# Patient Record
Sex: Female | Born: 1985 | Race: Black or African American | Hispanic: No | Smoking: Former smoker
Health system: Southern US, Community
[De-identification: ages and names within clinical notes are randomized; demographics above are authoritative.]

## PROBLEM LIST (undated history)

## (undated) ENCOUNTER — Inpatient Hospital Stay (HOSPITAL_COMMUNITY): Payer: Self-pay

---

## 2013-12-17 ENCOUNTER — Ambulatory Visit: Payer: Self-pay

## 2013-12-31 ENCOUNTER — Ambulatory Visit (INDEPENDENT_AMBULATORY_CARE_PROVIDER_SITE_OTHER): Payer: Self-pay

## 2013-12-31 DIAGNOSIS — IMO0001 Reserved for inherently not codable concepts without codable children: Secondary | ICD-10-CM

## 2013-12-31 DIAGNOSIS — Z3490 Encounter for supervision of normal pregnancy, unspecified, unspecified trimester: Secondary | ICD-10-CM

## 2013-12-31 DIAGNOSIS — Z3480 Encounter for supervision of other normal pregnancy, unspecified trimester: Secondary | ICD-10-CM

## 2013-12-31 LAB — POCT PREGNANCY, URINE: Preg Test, Ur: POSITIVE — AB

## 2013-12-31 LAB — HIV ANTIBODY (ROUTINE TESTING W REFLEX): HIV: NONREACTIVE

## 2013-12-31 LAB — GLUCOSE TOLERANCE, 1 HOUR (50G) W/O FASTING: GLUCOSE 1 HOUR GTT: 84 mg/dL (ref 70–140)

## 2013-12-31 NOTE — Progress Notes (Signed)
Pt came in for pregnancy test.  Resulted positive.  Pt states will be getting prenatal care here.  Pt informed me that she was unsure of LMP.  Scheduled a dating US for March 9th @ 145pm .  Prenatal labs drawn today along with early glucola due to patient informing me that her father is a diabetic.

## 2014-01-01 LAB — PRESCRIPTION MONITORING PROFILE (19 PANEL)
Amphetamine/Meth: NEGATIVE ng/mL
BARBITURATE SCREEN, URINE: NEGATIVE ng/mL
BUPRENORPHINE, URINE: NEGATIVE ng/mL
Benzodiazepine Screen, Urine: NEGATIVE ng/mL
CANNABINOID SCRN UR: NEGATIVE ng/mL
Carisoprodol, Urine: NEGATIVE ng/mL
Cocaine Metabolites: NEGATIVE ng/mL
Creatinine, Urine: 196 mg/dL (ref >20.0)
Fentanyl, Ur: NEGATIVE ng/mL
MDMA URINE: NEGATIVE ng/mL
METHAQUALONE SCREEN (URINE): NEGATIVE ng/mL
Meperidine, Ur: NEGATIVE ng/mL
Methadone Screen, Urine: NEGATIVE ng/mL
Nitrites, Initial: NEGATIVE ug/mL
OPIATE SCREEN, URINE: NEGATIVE ng/mL
Oxycodone Screen, Ur: NEGATIVE ng/mL
PHENCYCLIDINE, UR: NEGATIVE ng/mL
Propoxyphene: NEGATIVE ng/mL
TAPENTADOLUR: NEGATIVE ng/mL
Tramadol Scrn, Ur: NEGATIVE ng/mL
ZOLPIDEM, URINE: NEGATIVE ng/mL
pH, Initial: 6.6 pH (ref 4.5–8.9)

## 2014-01-01 LAB — OBSTETRIC PANEL
Antibody Screen: NEGATIVE
BASOS ABS: 0 10*3/uL (ref 0.0–0.1)
Basophils Relative: 0 % (ref 0–1)
EOS ABS: 0.1 10*3/uL (ref 0.0–0.7)
EOS PCT: 1 % (ref 0–5)
HCT: 36.2 % (ref 36.0–46.0)
HEP B S AG: NEGATIVE
Hemoglobin: 12.3 g/dL (ref 12.0–15.0)
LYMPHS PCT: 35 % (ref 12–46)
Lymphs Abs: 1.9 10*3/uL (ref 0.7–4.0)
MCH: 29.5 pg (ref 26.0–34.0)
MCHC: 34 g/dL (ref 30.0–36.0)
MCV: 86.8 fL (ref 78.0–100.0)
Monocytes Absolute: 0.2 10*3/uL (ref 0.1–1.0)
Monocytes Relative: 4 % (ref 3–12)
Neutro Abs: 3.2 10*3/uL (ref 1.7–7.7)
Neutrophils Relative %: 60 % (ref 43–77)
PLATELETS: 283 10*3/uL (ref 150–400)
RBC: 4.17 MIL/uL (ref 3.87–5.11)
RDW: 13.2 % (ref 11.5–15.5)
RUBELLA: 2.39 {index} — AB (ref <0.90)
Rh Type: POSITIVE
WBC: 5.4 10*3/uL (ref 4.0–10.5)

## 2014-01-01 LAB — CULTURE, OB URINE
COLONY COUNT: NO GROWTH
Organism ID, Bacteria: NO GROWTH

## 2014-01-02 LAB — HEMOGLOBINOPATHY EVALUATION
HGB A2 QUANT: 2.8 % (ref 2.2–3.2)
HGB A: 97.2 % (ref 96.8–97.8)
Hemoglobin Other: 0 %
Hgb F Quant: 0 % (ref 0.0–2.0)
Hgb S Quant: 0 %

## 2014-01-06 ENCOUNTER — Ambulatory Visit (HOSPITAL_COMMUNITY)
Admission: RE | Admit: 2014-01-06 | Discharge: 2014-01-06 | Disposition: A | Discharge status: Home / Self Care | Payer: Self-pay | Source: Ambulatory Visit | Attending: Obstetrics and Gynecology | Admitting: Obstetrics and Gynecology

## 2014-01-06 ENCOUNTER — Encounter (HOSPITAL_COMMUNITY): Payer: Self-pay

## 2014-01-06 DIAGNOSIS — Z3689 Encounter for other specified antenatal screening: Secondary | ICD-10-CM | POA: Insufficient documentation

## 2014-01-06 DIAGNOSIS — IMO0001 Reserved for inherently not codable concepts without codable children: Secondary | ICD-10-CM

## 2014-01-06 DIAGNOSIS — O43899 Other placental disorders, unspecified trimester: Secondary | ICD-10-CM

## 2014-01-06 DIAGNOSIS — O368990 Maternal care for other specified fetal problems, unspecified trimester, not applicable or unspecified: Secondary | ICD-10-CM | POA: Insufficient documentation

## 2014-01-07 ENCOUNTER — Telehealth: Payer: Self-pay

## 2014-01-07 ENCOUNTER — Encounter: Payer: Self-pay | Admitting: Obstetrics and Gynecology

## 2014-01-07 DIAGNOSIS — O3680X0 Pregnancy with inconclusive fetal viability, not applicable or unspecified: Secondary | ICD-10-CM

## 2014-01-07 NOTE — Telephone Encounter (Signed)
Message copied by Louanna RawAMPBELL, Angelissa Supan M on Tue Jan 07, 2014  1:52 PM ------      Message from: CONSTANT, Gigi GinPEGGY      Created: Tue Jan 07, 2014  9:05 AM       Patient needs repeat ultrasound in 1 week to confirm viability prior to initiating prenatal care on 3/31. Ultrasound showed a 6 week fetus without cardiac activity (could represent early pregnancy vs failed miscarriage)            Please schedule f/u ultrasound and inform patient.            Thanks            Clinical cytogeneticisteggy ------

## 2014-01-07 NOTE — Telephone Encounter (Signed)
Follow up ultrasound scheduled for 1600 on March 17th. Called pt. And informed her of appointment date and time. Pt. Verbalized understanding and gratitude and had no further questions or concerns.

## 2014-01-14 ENCOUNTER — Ambulatory Visit (HOSPITAL_COMMUNITY)
Admission: RE | Admit: 2014-01-14 | Discharge: 2014-01-14 | Disposition: A | Discharge status: Home / Self Care | Payer: Self-pay | Source: Ambulatory Visit | Attending: Obstetrics and Gynecology | Admitting: Obstetrics and Gynecology

## 2014-01-14 ENCOUNTER — Other Ambulatory Visit: Payer: Self-pay | Admitting: Family Medicine

## 2014-01-14 ENCOUNTER — Other Ambulatory Visit: Payer: Self-pay | Admitting: Obstetrics and Gynecology

## 2014-01-14 DIAGNOSIS — O3680X0 Pregnancy with inconclusive fetal viability, not applicable or unspecified: Secondary | ICD-10-CM

## 2014-01-14 DIAGNOSIS — O208 Other hemorrhage in early pregnancy: Secondary | ICD-10-CM | POA: Insufficient documentation

## 2014-01-14 DIAGNOSIS — Z3689 Encounter for other specified antenatal screening: Secondary | ICD-10-CM | POA: Insufficient documentation

## 2014-01-14 DIAGNOSIS — O021 Missed abortion: Secondary | ICD-10-CM | POA: Insufficient documentation

## 2014-01-14 MED ORDER — MISOPROSTOL 200 MCG PO TABS: 1000.0000 ug | ORAL_TABLET | Freq: Once | ORAL | Status: DC

## 2014-01-14 NOTE — Progress Notes (Signed)
TVUS is consistent with failed IUP, 6 wk size fetus with no FHR, no change over 7 day interval. Pt. Advised of diagnosis--options given.  Elects cytotec.  Called to pharmacy.  Instructions given.

## 2014-01-28 ENCOUNTER — Encounter: Payer: Self-pay | Admitting: Obstetrics and Gynecology

## 2014-02-14 ENCOUNTER — Inpatient Hospital Stay (HOSPITAL_COMMUNITY)
Admission: AD | Admit: 2014-02-14 | Discharge: 2014-02-15 | Disposition: A | Discharge status: Home / Self Care | Payer: Self-pay | Source: Ambulatory Visit | Attending: Family Medicine | Admitting: Family Medicine

## 2014-02-14 ENCOUNTER — Encounter (HOSPITAL_COMMUNITY): Payer: Self-pay | Admitting: *Deleted

## 2014-02-14 DIAGNOSIS — A499 Bacterial infection, unspecified: Secondary | ICD-10-CM | POA: Insufficient documentation

## 2014-02-14 DIAGNOSIS — IMO0001 Reserved for inherently not codable concepts without codable children: Secondary | ICD-10-CM | POA: Insufficient documentation

## 2014-02-14 DIAGNOSIS — N760 Acute vaginitis: Secondary | ICD-10-CM | POA: Insufficient documentation

## 2014-02-14 DIAGNOSIS — B9689 Other specified bacterial agents as the cause of diseases classified elsewhere: Secondary | ICD-10-CM | POA: Insufficient documentation

## 2014-02-14 DIAGNOSIS — Z87891 Personal history of nicotine dependence: Secondary | ICD-10-CM | POA: Insufficient documentation

## 2014-02-14 DIAGNOSIS — R1031 Right lower quadrant pain: Secondary | ICD-10-CM | POA: Insufficient documentation

## 2014-02-14 DIAGNOSIS — M549 Dorsalgia, unspecified: Secondary | ICD-10-CM | POA: Insufficient documentation

## 2014-02-14 DIAGNOSIS — M7918 Myalgia, other site: Secondary | ICD-10-CM

## 2014-02-14 LAB — POCT PREGNANCY, URINE: Preg Test, Ur: NEGATIVE

## 2014-02-14 NOTE — MAU Note (Signed)
Having pain in my R back and R lower abd while at work. Felt crampy. I'm on my feet a lot at work and live on 3rd floor and just hurts. Worse with activity. Like a cramp that shoots up my R back. Recently had miscarriage about 01/15/14. Took pills to pass pregnancy. Have appt Weds in clinic

## 2014-02-15 ENCOUNTER — Encounter (HOSPITAL_COMMUNITY): Payer: Self-pay | Admitting: *Deleted

## 2014-02-15 ENCOUNTER — Inpatient Hospital Stay (HOSPITAL_COMMUNITY): Payer: Self-pay

## 2014-02-15 DIAGNOSIS — IMO0001 Reserved for inherently not codable concepts without codable children: Secondary | ICD-10-CM

## 2014-02-15 LAB — URINALYSIS, ROUTINE W REFLEX MICROSCOPIC
BILIRUBIN URINE: NEGATIVE
Glucose, UA: NEGATIVE mg/dL
Hgb urine dipstick: NEGATIVE
KETONES UR: NEGATIVE mg/dL
Leukocytes, UA: NEGATIVE
Nitrite: NEGATIVE
PROTEIN: NEGATIVE mg/dL
Specific Gravity, Urine: 1.025 (ref 1.005–1.030)
UROBILINOGEN UA: 0.2 mg/dL (ref 0.0–1.0)
pH: 6 (ref 5.0–8.0)

## 2014-02-15 LAB — WET PREP, GENITAL
Trich, Wet Prep: NONE SEEN
YEAST WET PREP: NONE SEEN

## 2014-02-15 LAB — CBC
HEMATOCRIT: 34.3 % — AB (ref 36.0–46.0)
Hemoglobin: 11.4 g/dL — ABNORMAL LOW (ref 12.0–15.0)
MCH: 29.7 pg (ref 26.0–34.0)
MCHC: 33.2 g/dL (ref 30.0–36.0)
MCV: 89.3 fL (ref 78.0–100.0)
Platelets: 229 10*3/uL (ref 150–400)
RBC: 3.84 MIL/uL — ABNORMAL LOW (ref 3.87–5.11)
RDW: 12.8 % (ref 11.5–15.5)
WBC: 6.9 10*3/uL (ref 4.0–10.5)

## 2014-02-15 MED ORDER — CYCLOBENZAPRINE HCL 10 MG PO TABS: 5.0000 mg | ORAL_TABLET | Freq: Three times a day (TID) | ORAL | Status: AC | PRN

## 2014-02-15 MED ORDER — METRONIDAZOLE 500 MG PO TABS: 500.0000 mg | ORAL_TABLET | Freq: Two times a day (BID) | ORAL | Status: DC

## 2014-02-15 NOTE — Discharge Instructions (Signed)
Bacterial Vaginosis Bacterial vaginosis is a vaginal infection that occurs when the normal balance of bacteria in the vagina is disrupted. It results from an overgrowth of certain bacteria. This is the most common vaginal infection in women of childbearing age. Treatment is important to prevent complications, especially in pregnant women, as it can cause a premature delivery. CAUSES  Bacterial vaginosis is caused by an increase in harmful bacteria that are normally present in smaller amounts in the vagina. Several different kinds of bacteria can cause bacterial vaginosis. However, the reason that the condition develops is not fully understood. RISK FACTORS Certain activities or behaviors can put you at an increased risk of developing bacterial vaginosis, including:  Having a new sex partner or multiple sex partners.  Douching.  Using an intrauterine device (IUD) for contraception. Women do not get bacterial vaginosis from toilet seats, bedding, swimming pools, or contact with objects around them. SIGNS AND SYMPTOMS  Some women with bacterial vaginosis have no signs or symptoms. Common symptoms include:  Grey vaginal discharge.  A fishlike odor with discharge, especially after sexual intercourse.  Itching or burning of the vagina and vulva.  Burning or pain with urination. DIAGNOSIS  Your health care provider will take a medical history and examine the vagina for signs of bacterial vaginosis. A sample of vaginal fluid may be taken. Your health care provider will look at this sample under a microscope to check for bacteria and abnormal cells. A vaginal pH test may also be done.  TREATMENT  Bacterial vaginosis may be treated with antibiotic medicines. These may be given in the form of a pill or a vaginal cream. A second round of antibiotics may be prescribed if the condition comes back after treatment.  HOME CARE INSTRUCTIONS   Only take over-the-counter or prescription medicines as  directed by your health care provider.  If antibiotic medicine was prescribed, take it as directed. Make sure you finish it even if you start to feel better.  Do not have sex until treatment is completed.  Tell all sexual partners that you have a vaginal infection. They should see their health care provider and be treated if they have problems, such as a mild rash or itching.  Practice safe sex by using condoms and only having one sex partner. SEEK MEDICAL CARE IF:   Your symptoms are not improving after 3 days of treatment.  You have increased discharge or pain.  You have a fever. MAKE SURE YOU:   Understand these instructions.  Will watch your condition.  Will get help right away if you are not doing well or get worse. FOR MORE INFORMATION  Centers for Disease Control and Prevention, Division of STD Prevention: SolutionApps.co.zawww.cdc.gov/std American Sexual Health Association (ASHA): www.ashastd.org  Document Released: 10/17/2005 Document Revised: 08/07/2013 Document Reviewed: 05/29/2013 [employer] [facility] Patient Information 2014 [place], [place].   Abdominal Pain, Women Abdominal (stomach, pelvic, or belly) pain can be caused by many things. It is important to tell your doctor:  The location of the pain.  Does it come and go or is it present all the time?  Are there things that start the pain (eating certain foods, exercise)?  Are there other symptoms associated with the pain (fever, nausea, vomiting, diarrhea)? All of this is helpful to know when trying to find the cause of the pain. CAUSES   Stomach: virus or bacteria infection, or ulcer.  Intestine: appendicitis (inflamed appendix), regional ileitis (Crohn's disease), ulcerative colitis (inflamed colon), irritable bowel syndrome, diverticulitis (inflamed diverticulum of  the colon), or cancer of the stomach or intestine.  Gallbladder disease or stones in the gallbladder.  Kidney disease, kidney stones, or infection.  Pancreas infection  or cancer.  Fibromyalgia (pain disorder).  Diseases of the female organs:  Uterus: fibroid (non-cancerous) tumors or infection.  Fallopian tubes: infection or tubal pregnancy.  Ovary: cysts or tumors.  Pelvic adhesions (scar tissue).  Endometriosis (uterus lining tissue growing in the pelvis and on the pelvic organs).  Pelvic congestion syndrome (female organs filling up with blood just before the menstrual period).  Pain with the menstrual period.  Pain with ovulation (producing an egg).  Pain with an IUD (intrauterine device, birth control) in the uterus.  Cancer of the female organs.  Functional pain (pain not caused by a disease, may improve without treatment).  Psychological pain.  Depression. DIAGNOSIS  Your doctor will decide the seriousness of your pain by doing an examination.  Blood tests.  X-rays.  Ultrasound.  CT scan (computed tomography, special type of X-ray).  MRI (magnetic resonance imaging).  Cultures, for infection.  Barium enema (dye inserted in the large intestine, to better view it with X-rays).  Colonoscopy (looking in intestine with a lighted tube).  Laparoscopy (minor surgery, looking in abdomen with a lighted tube).  Major abdominal exploratory surgery (looking in abdomen with a large incision). TREATMENT  The treatment will depend on the cause of the pain.   Many cases can be observed and treated at home.  Over-the-counter medicines recommended by your caregiver.  Prescription medicine.  Antibiotics, for infection.  Birth control pills, for painful periods or for ovulation pain.  Hormone treatment, for endometriosis.  Nerve blocking injections.  Physical therapy.  Antidepressants.  Counseling with a psychologist or psychiatrist.  Minor or major surgery. HOME CARE INSTRUCTIONS   Do not take laxatives, unless directed by your caregiver.  Take over-the-counter pain medicine only if ordered by your caregiver. Do  not take aspirin because it can cause an upset stomach or bleeding.  Try a clear liquid diet (broth or water) as ordered by your caregiver. Slowly move to a bland diet, as tolerated, if the pain is related to the stomach or intestine.  Have a thermometer and take your temperature several times a day, and record it.  Bed rest and sleep, if it helps the pain.  Avoid sexual intercourse, if it causes pain.  Avoid stressful situations.  Keep your follow-up appointments and tests, as your caregiver orders.  If the pain does not go away with medicine or surgery, you may try:  Acupuncture.  Relaxation exercises (yoga, meditation).  Group therapy.  Counseling. SEEK MEDICAL CARE IF:   You notice certain foods cause stomach pain.  Your home care treatment is not helping your pain.  You need stronger pain medicine.  You want your IUD removed.  You feel faint or lightheaded.  You develop nausea and vomiting.  You develop a rash.  You are having side effects or an allergy to your medicine. SEEK IMMEDIATE MEDICAL CARE IF:   Your pain does not go away or gets worse.  You have a fever.  Your pain is felt only in portions of the abdomen. The right side could possibly be appendicitis. The left lower portion of the abdomen could be colitis or diverticulitis.  You are passing blood in your stools (bright red or black tarry stools, with or without vomiting).  You have blood in your urine.  You develop chills, with or without a fever.  You  pass out. MAKE SURE YOU:   Understand these instructions.  Will watch your condition.  Will get help right away if you are not doing well or get worse. Document Released: 08/14/2007 Document Revised: 01/09/2012 Document Reviewed: 09/03/2009 [facility] Patient Information 2014 [place]ExitCare, [place]LL[place].  Musculoskeletal Pain Musculoskeletal pain is muscle and boney aches and pains. These pains can occur in any part of the body. Your caregiver may  treat you without knowing the cause of the pain. They may treat you if blood or urine tests, X-rays, and other tests were normal.  CAUSES There is often not a definite cause or reason for these pains. These pains may be caused by a type of germ (virus). The discomfort may also come from overuse. Overuse includes working out too hard when your body is not fit. Boney aches also come from weather changes. Bone is sensitive to atmospheric pressure changes. HOME CARE INSTRUCTIONS   Ask when your test results will be ready. Make sure you get your test results.  Only take over-the-counter or prescription medicines for pain, discomfort, or fever as directed by your caregiver. If you were given medications for your condition, do not drive, operate machinery or power tools, or sign legal documents for 24 hours. Do not drink alcohol. Do not take sleeping pills or other medications that may interfere with treatment.  Continue all activities unless the activities cause more pain. When the pain lessens, slowly resume normal activities. Gradually increase the intensity and duration of the activities or exercise.  During periods of severe pain, bed rest may be helpful. Lay or sit in any position that is comfortable.  Putting ice on the injured area.  Put ice in a bag.  Place a towel between your skin and the bag.  Leave the ice on for 15 to 20 minutes, 3 to 4 times a day.  Follow up with your caregiver for continued problems and no reason can be found for the pain. If the pain becomes worse or does not go away, it may be necessary to repeat tests or do additional testing. Your caregiver may need to look further for a possible cause. SEEK IMMEDIATE MEDICAL CARE IF:  You have pain that is getting worse and is not relieved by medications.  You develop chest pain that is associated with shortness or breath, sweating, feeling sick to your stomach (nauseous), or throw up (vomit).  Your pain becomes localized  to the abdomen.  You develop any new symptoms that seem different or that concern you. MAKE SURE YOU:   Understand these instructions.  Will watch your condition.  Will get help right away if you are not doing well or get worse. Document Released: 10/17/2005 Document Revised: 01/09/2012 Document Reviewed: 06/21/2013 [facility] Patient Information 2014 [place]ExitCare, [place]LLC.

## 2014-02-15 NOTE — MAU Provider Note (Signed)
Attestation of Attending Supervision of Advanced Practitioner (PA/CNM/NP): Evaluation and management procedures were performed by the Advanced Practitioner under my supervision and collaboration.  I have reviewed the Advanced Practitioner's note and chart, and I agree with the management and plan.  Murlean Seelye S Maliyah Willets, MD [facility] Attending 02/15/2014 7:35 AM   

## 2014-02-15 NOTE — MAU Provider Note (Signed)
Chief Complaint: Back Pain and Abdominal Pain   First Provider Initiated Contact with Patient 02/15/14 0122     SUBJECTIVE HPI: Hannah Burch is a 28 y.o. [id number] at 1 month S/P missed AB Tx w/ cytotec who presents with intermittent RLQ pain and right LBP x 1 week. The pt reports passing a large amount of tissue and blood the day after taking cytotec. Bleeding lasted 1 or 2 days and stopped completely. LMP 02/05/2014 was heavier than usual, but otherwise normal. Pain started around time that menstrual cycle started, but is not like normal cramps and didn't resolved after end of menstrual bleeding. Rates pain 10/10 when it happens. Worse w/ mvmt. Works on her feet all day at work. Very active. Minimal pain now. Has taken Ibuprofen and Tylenol for pain w/ little improvement. Resumed IC 1.5 weeks ago. No BC. Is not TTC.     History reviewed. No pertinent past medical history. OB History  Gravida Para Term Preterm AB SAB TAB Ectopic Multiple Living  3 2 1 1 1 1    2     # Outcome Date GA Lbr Len/2nd Weight Sex Delivery Anes PTL Lv  3 TRM 12/05/12    M SVD   Y  2 PRE 08/15/08    F SVD   Y     Comments: System Generated. Please review and update pregnancy details.  1 SAB              History reviewed. No pertinent past surgical history. History   Social History  . Marital Status: Married    Spouse Name: N/A    Number of Children: N/A  . Years of Education: N/A   Occupational History  . Not on file.   Social History Main Topics  . Smoking status: Former Games developermoker  . Smokeless tobacco: Not on file  . Alcohol Use: No  . Drug Use: No  . Sexual Activity: Yes   Other Topics Concern  . Not on file   Social History Narrative  . No narrative on file   No current facility-administered medications on file prior to encounter.   Current Outpatient Prescriptions on File Prior to Encounter  Medication Sig Dispense Refill  . misoprostol (CYTOTEC) 200 MCG tablet Place 5 tablets (1,000 mcg total)  vaginally once. Repeat in 24 hour if no results  10 tablet  0   No Known Allergies  ROS: Pertinent pos items in HPI. Neg for fever, chills, N/V/D/C, vaginal bleeding, vaginal discharge, urinary complaints, dyspareunia, recent injury. Last BM yesterday, normal. Normal appetite.   OBJECTIVE Blood pressure 107/63, pulse 68, temperature 98 F (36.7 C), resp. rate 18, height 5' (1.524 m), weight 52.527 kg (115 lb 12.8 oz), last menstrual period 02/05/2014, unknown if currently breastfeeding. GENERAL: Well-developed, well-nourished female in no acute distress.  HEENT: Normocephalic HEART: normal rate RESP: normal effort ABDOMEN: Soft, non-tender. Ps BS x 4. Neg mass or rebound.  BACK: mild right low back TTP. No CVAT.  EXTREMITIES: Nontender, no edema NEURO: Alert and oriented SPECULUM EXAM: NEFG, moderate creamy pink-tinged, malodorous discharge, cervix clean, non-friable BIMANUAL: cervix closed; uterus normal size, no adnexal tenderness or masses. No CMT.   LAB RESULTS Results for orders placed during the hospital encounter of 02/14/14 (from the past 24 hour(s))  URINALYSIS, ROUTINE W REFLEX MICROSCOPIC     Status: None   Collection Time    02/14/14 11:22 PM      Result Value Ref Range   Color, Urine YELLOW  YELLOW  APPearance CLEAR  CLEAR   Specific Gravity, Urine 1.025  1.005 - 1.030   pH 6.0  5.0 - 8.0   Glucose, UA NEGATIVE  NEGATIVE mg/dL   Hgb urine dipstick NEGATIVE  NEGATIVE   Bilirubin Urine NEGATIVE  NEGATIVE   Ketones, ur NEGATIVE  NEGATIVE mg/dL   Protein, ur NEGATIVE  NEGATIVE mg/dL   Urobilinogen, UA 0.2  0.0 - 1.0 mg/dL   Nitrite NEGATIVE  NEGATIVE   Leukocytes, UA NEGATIVE  NEGATIVE  POCT PREGNANCY, URINE     Status: None   Collection Time    02/14/14 11:50 PM      Result Value Ref Range   Preg Test, Ur NEGATIVE  NEGATIVE  CBC     Status: Abnormal   Collection Time    02/15/14 12:55 AM      Result Value Ref Range   WBC 6.9  4.0 - 10.5 K/uL   RBC 3.84 (*)  3.87 - 5.11 MIL/uL   Hemoglobin 11.4 (*) 12.0 - 15.0 g/dL   HCT 40.934.3 (*) 81.136.0 - 91.446.0 %   MCV 89.3  78.0 - 100.0 fL   MCH 29.7  26.0 - 34.0 pg   MCHC 33.2  30.0 - 36.0 g/dL   RDW 78.212.8  95.611.5 - 21.315.5 %   Platelets 229  150 - 400 K/uL  WET PREP, GENITAL     Status: Abnormal   Collection Time    02/15/14  2:20 AM      Result Value Ref Range   Yeast Wet Prep HPF POC NONE SEEN  NONE SEEN   Trich, Wet Prep NONE SEEN  NONE SEEN   Clue Cells Wet Prep HPF POC FEW (*) NONE SEEN   WBC, Wet Prep HPF POC MODERATE (*) NONE SEEN    IMAGING [place] Transvaginal Non-ob  02/15/2014   CLINICAL DATA:  Right lower quadrant and back pain. Recent spontaneous abortion March 2015.  EXAM: TRANSVAGINAL ULTRASOUND OF PELVIS  DOPPLER ULTRASOUND OF OVARIES  TECHNIQUE: Transvaginal ultrasound examination of the pelvis was performed including evaluation of the uterus, ovaries, adnexal regions, and pelvic cul-de-sac.  Color and duplex Doppler ultrasound was utilized to evaluate blood flow to the ovaries.  COMPARISON:  01/14/2014  FINDINGS: Uterus  Measurements: 9.7 x 4.6 x 5.3 cm. No fibroids or other mass visualized.  Endometrium  Thickness: 2.3 mm, normal.  No focal abnormality visualized.  Right ovary  Measurements: 3.4 x 2.4 x 2.5 cm. Normal appearance/no adnexal mass.  Left ovary  Measurements: 2.4 x 1.7 x 1.8 cm. Normal appearance/no adnexal mass.  Pulsed Doppler evaluation demonstrates normal low-resistance arterial and venous waveforms in both ovaries.  No free pelvic fluid collections.  IMPRESSION: Normal ultrasound appearance of the uterus and ovaries.   Electronically Signed   By: Burman NievesWilliam  Stevens M.D.   On: 02/15/2014 01:59   [place] Art/ven Flow Abd Pelv Doppler Limited  02/15/2014   CLINICAL DATA:  Right lower quadrant and back pain. Recent spontaneous abortion March 2015.  EXAM: TRANSVAGINAL ULTRASOUND OF PELVIS  DOPPLER ULTRASOUND OF OVARIES  TECHNIQUE: Transvaginal ultrasound examination of the pelvis was performed  including evaluation of the uterus, ovaries, adnexal regions, and pelvic cul-de-sac.  Color and duplex Doppler ultrasound was utilized to evaluate blood flow to the ovaries.  COMPARISON:  01/14/2014  FINDINGS: Uterus  Measurements: 9.7 x 4.6 x 5.3 cm. No fibroids or other mass visualized.  Endometrium  Thickness: 2.3 mm, normal.  No focal abnormality visualized.  Right ovary  Measurements: 3.4  x 2.4 x 2.5 cm. Normal appearance/no adnexal mass.  Left ovary  Measurements: 2.4 x 1.7 x 1.8 cm. Normal appearance/no adnexal mass.  Pulsed Doppler evaluation demonstrates normal low-resistance arterial and venous waveforms in both ovaries.  No free pelvic fluid collections.  IMPRESSION: Normal ultrasound appearance of the uterus and ovaries.   Electronically Signed   By: Burman Nieves M.D.   On: 02/15/2014 01:59   MAU COURSE/MDM Low suspicion for appendicitis due to location of pain and absence of fever, leukocytosis and GI complaints.    ASSESSMENT 1. Musculoskeletal pain   2. BV (bacterial vaginosis)    PLAN Discharge home in stable condition. Comfort measures. Follow-up Information   Follow up with [facility] On 02/19/2014. (As scheduled)    Contact information:   [address] [place] [address] [phone]       Follow up with Primary Care Provider. (As needed if symptoms continue or worsen. )        Medication List    STOP taking these medications       misoprostol 200 MCG tablet  Commonly known as:  CYTOTEC      TAKE these medications       acetaminophen 325 MG tablet  Commonly known as:  TYLENOL  Take 650 mg by mouth every 6 (six) hours as needed.     cyclobenzaprine 10 MG tablet  Commonly known as:  FLEXERIL  Take 0.5-1 tablets (5-10 mg total) by mouth 3 (three) times daily as needed for muscle spasms.     ibuprofen 200 MG tablet  Commonly known as:  ADVIL,MOTRIN  Take 600 mg by mouth every 6 (six) hours as needed.     metroNIDAZOLE 500 MG tablet   Commonly known as:  FLAGYL  Take 1 tablet (500 mg total) by mouth 2 (two) times daily.     multivitamin with minerals tablet  Take 1 tablet by mouth daily.       [place], CNM 02/15/2014  2:40 AM

## 2014-02-17 LAB — GC/CHLAMYDIA PROBE AMP
CT Probe RNA: NEGATIVE
GC Probe RNA: NEGATIVE

## 2014-02-19 ENCOUNTER — Encounter: Payer: Self-pay | Admitting: Advanced Practice Midwife

## 2014-09-01 ENCOUNTER — Encounter (HOSPITAL_COMMUNITY): Payer: Self-pay | Admitting: *Deleted

## 2014-10-31 NOTE — L&D Delivery Note (Signed)
Delivery Note 1615: Nurse call reports patient c/o increased and constant rectal pressure.  In room to assess and SVE: C/C/+2. Patient instructed on pushing techniques and delivered as below with staff and family support.  Infant HR remained reassuring throughout the pushing stage.   At 4:33 PM, on June 01, 2015, a viable female "Leonette Nutting" was delivered via  (Presentation: Left Occiput Anterior with restitution to LOT). Shoulders delivered easily and infant with good tone and spontaneous cry. Tactile stimulation given by provider and infant placed on mother's abdomen where nurse continued tactile stimulation. Infant APGAR: 9,9.  Cord clamped, cut, and placenta delivered spontaneously.  Placenta given to Cord Blood Collection nurse for donation.  Vaginal inspection revealed small periurethral laceration that was repaired with 3-0 vicryl suture on SH needle.  Patient required no additional or local anesthetic and tolerated the repair well.  Fundus firm, at the umbilicus, and bleeding small.  Mother hemodynamically stable and infant skin to skin prior to provider exit.  Mother unsure of birth control method and opts to breastfeed.  Infant weight at one hour of life: 7lbs 4.6oz, 21in  Anesthesia:  Epidural Episiotomy:  None Lacerations:  PeriUrethral Suture Repair: 3.0 vicryl Est. Blood Loss (mL):  39  Mom to postpartum.  Baby to Couplet care / Skin to Skin.  Marlene Bast MSN, CNM 07/02/2015, 5:11 PM

## 2014-11-19 LAB — OB RESULTS CONSOLE GC/CHLAMYDIA
Chlamydia: NEGATIVE
Gonorrhea: NEGATIVE

## 2014-11-19 LAB — OB RESULTS CONSOLE RPR: RPR: NONREACTIVE

## 2014-11-19 LAB — OB RESULTS CONSOLE HIV ANTIBODY (ROUTINE TESTING): HIV: NONREACTIVE

## 2014-11-19 LAB — OB RESULTS CONSOLE ABO/RH: RH TYPE: POSITIVE

## 2014-11-19 LAB — OB RESULTS CONSOLE RUBELLA ANTIBODY, IGM: RUBELLA: IMMUNE

## 2014-11-19 LAB — OB RESULTS CONSOLE HEPATITIS B SURFACE ANTIGEN: Hepatitis B Surface Ag: NEGATIVE

## 2014-11-19 LAB — OB RESULTS CONSOLE ANTIBODY SCREEN: ANTIBODY SCREEN: NEGATIVE

## 2014-11-29 IMAGING — US US OB TRANSVAGINAL
1 series · 14 of 28 positions shown · non-contrast
Comparison: None.

CLINICAL DATA: Pregnant, follow-up viability

EXAM:
TRANSVAGINAL OB ULTRASOUND
TECHNIQUE: Transvaginal ultrasound was performed for complete evaluation of the
gestation as well as the maternal uterus, adnexal regions, and
pelvic cul-de-sac.

[Series 1: us ob transvaginal · 14 of 40 slices shown]
[im 2/40]
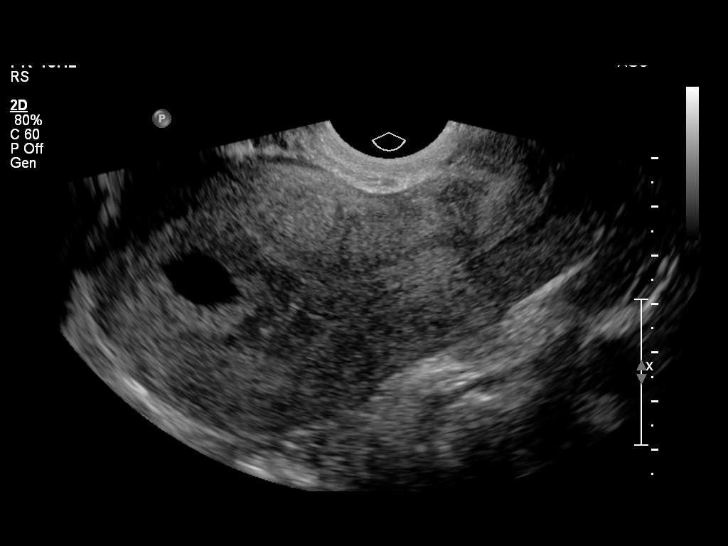
[im 5/40]
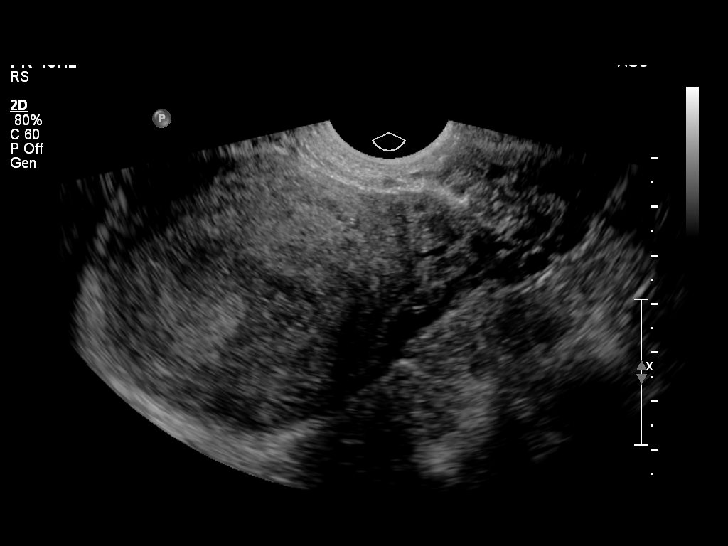
[im 8/40]
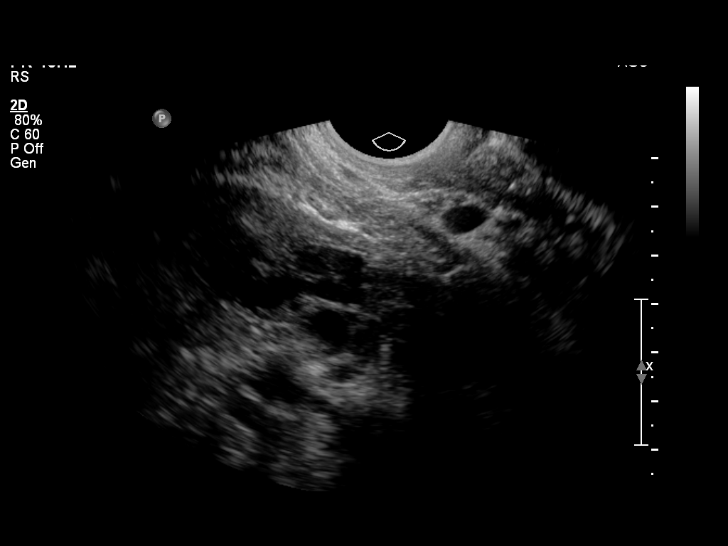
[im 11/40]
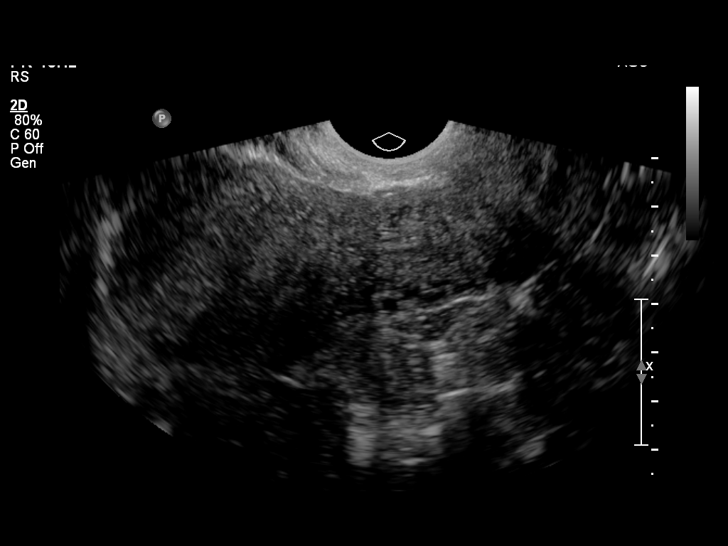
[im 14/40]
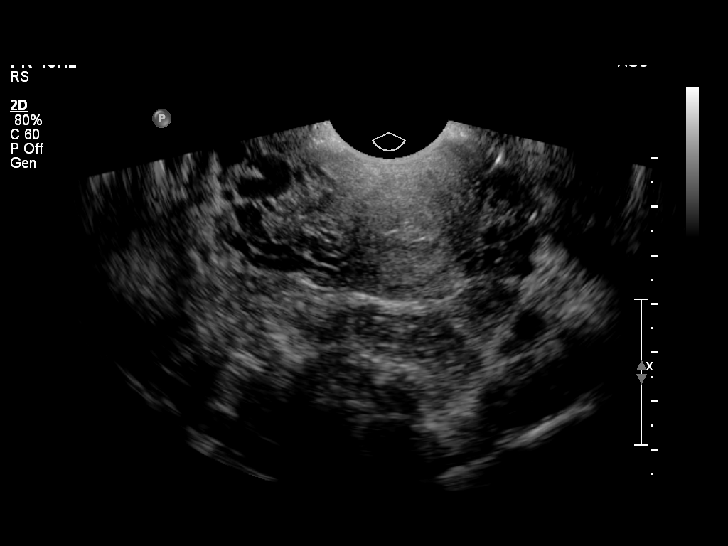
[im 16/40]
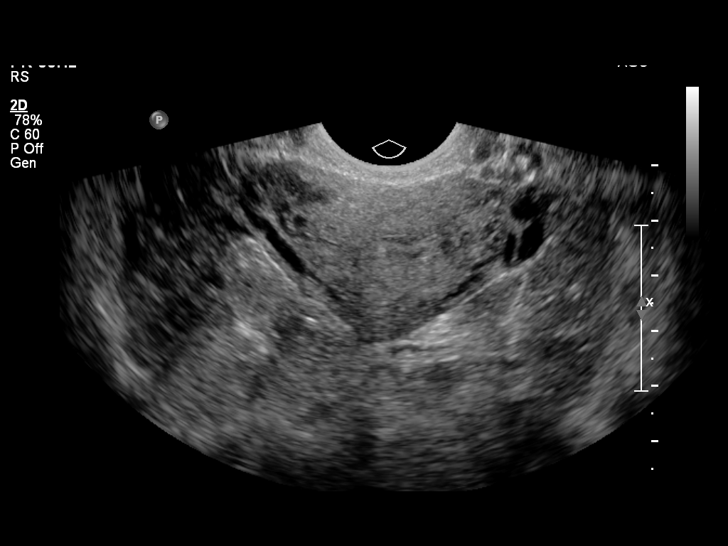
[im 19/40]
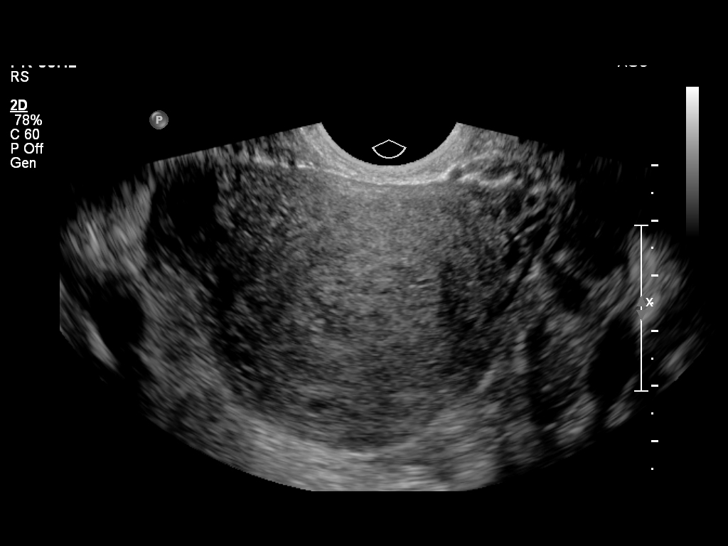
[im 22/40]
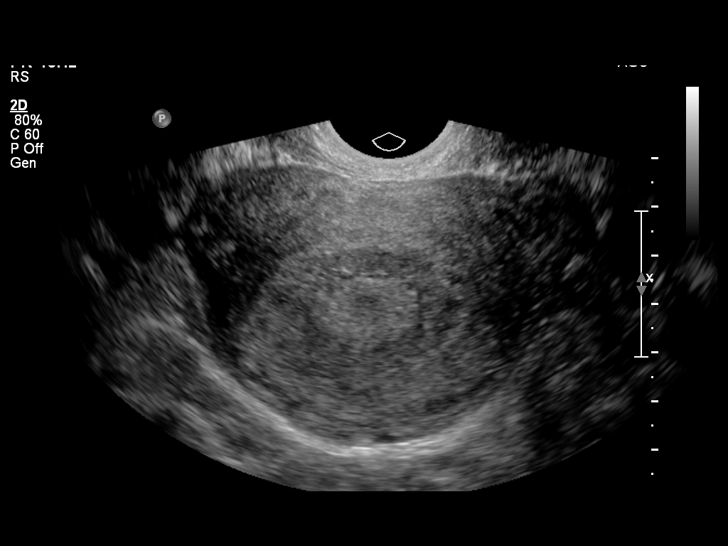
[im 25/40]
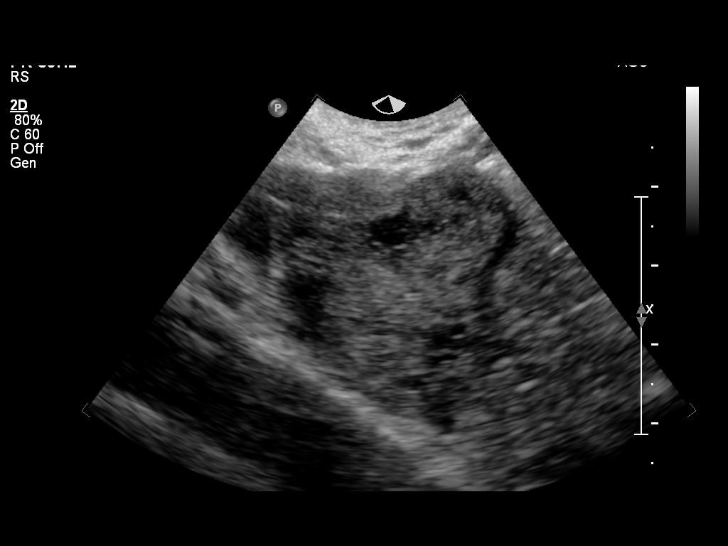
[im 28/40]
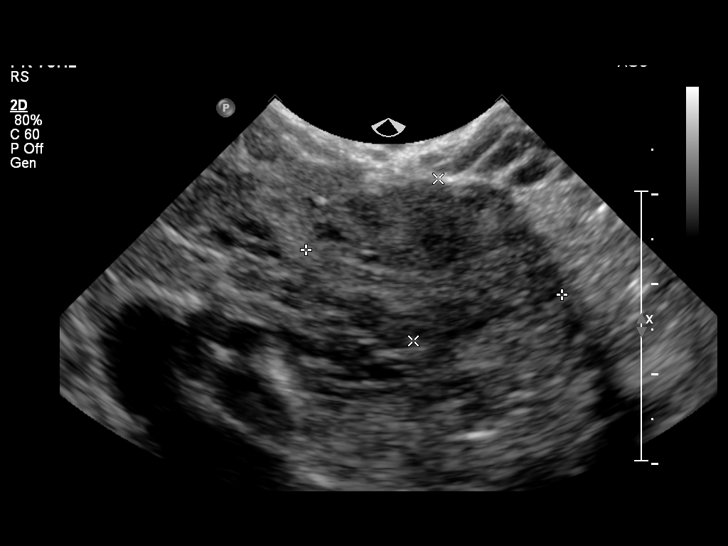
[im 31/40]
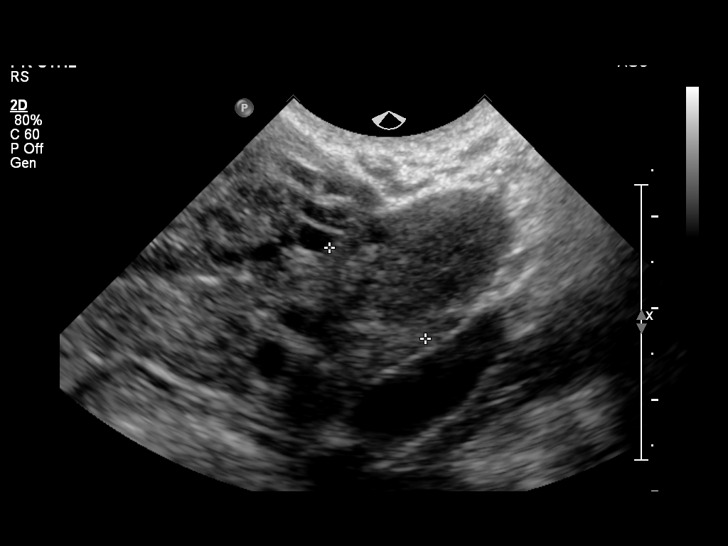
[im 34/40]
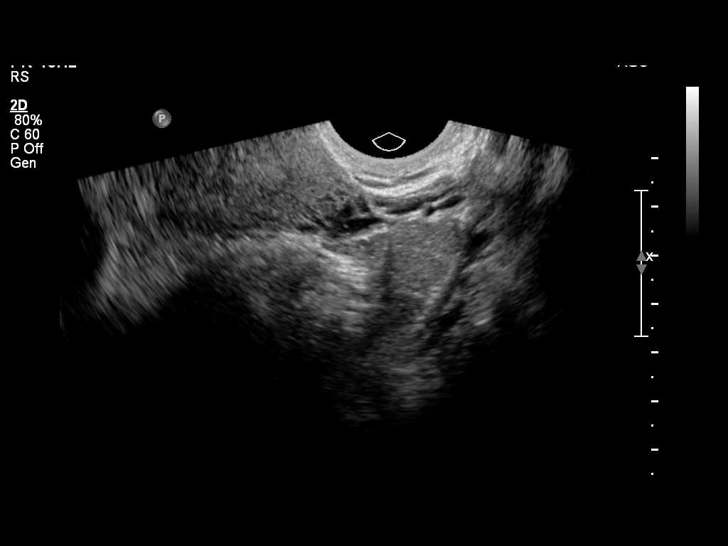
[im 37/40]
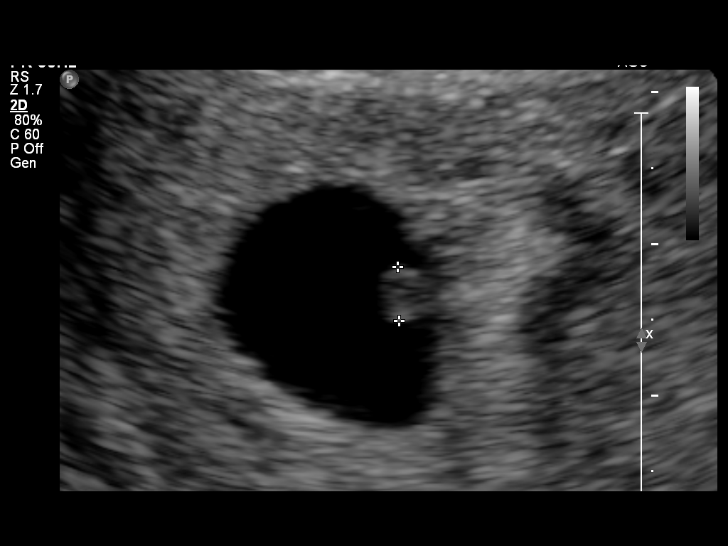
[im 40/40]
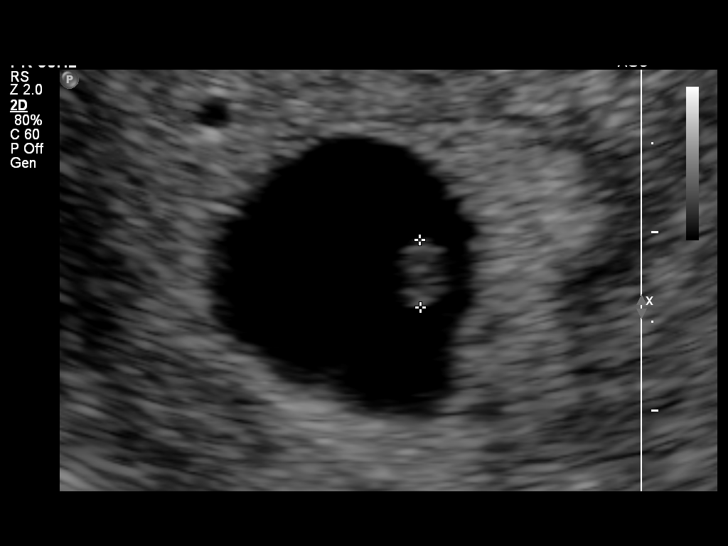

[14 of 28 positions shown; findings below may reference images not displayed]

FINDINGS: Intrauterine gestational sac: Visualized/normal in shape.

Yolk sac:  Present

Embryo:  Present

Cardiac Activity: Not visualized

CRL:   3.7  mm   6 w 1 d

Maternal uterus/adnexae: Small subchorionic hemorrhage.

Right ovary is within normal limits, measuring 1.8 x 2.9 x 2.6 cm.

Left ovary is within normal limits, measuring 1.7 x 3.2 x 1.4 cm.

No free fluid.
IMPRESSION: Single intrauterine gestation without cardiac activity or interval
growth. Crown-rump length measures 3.7 mm, previously 4.9 mm.

Technically, this remains suspicious for nonviability (rather than
definitive) when using strict criteria for initial ultrasound.
However, the regression in size of the crown-rump indicates
nonviability.

## 2015-06-22 LAB — OB RESULTS CONSOLE GBS: GBS: POSITIVE

## 2015-06-28 ENCOUNTER — Inpatient Hospital Stay (HOSPITAL_COMMUNITY)
Admission: AD | Admit: 2015-06-28 | Discharge: 2015-06-28 | Disposition: A | Discharge status: Home / Self Care | Payer: BLUE CROSS/BLUE SHIELD | Source: Ambulatory Visit | Attending: Obstetrics and Gynecology | Admitting: Obstetrics and Gynecology

## 2015-06-28 ENCOUNTER — Encounter (HOSPITAL_COMMUNITY): Payer: Self-pay

## 2015-06-28 DIAGNOSIS — Z8751 Personal history of pre-term labor: Secondary | ICD-10-CM

## 2015-06-28 DIAGNOSIS — O368130 Decreased fetal movements, third trimester, not applicable or unspecified: Secondary | ICD-10-CM | POA: Insufficient documentation

## 2015-06-28 DIAGNOSIS — O368190 Decreased fetal movements, unspecified trimester, not applicable or unspecified: Secondary | ICD-10-CM

## 2015-06-28 DIAGNOSIS — Z3A37 37 weeks gestation of pregnancy: Secondary | ICD-10-CM | POA: Insufficient documentation

## 2015-06-28 DIAGNOSIS — Z87891 Personal history of nicotine dependence: Secondary | ICD-10-CM | POA: Diagnosis not present

## 2015-06-28 DIAGNOSIS — O479 False labor, unspecified: Secondary | ICD-10-CM

## 2015-06-28 DIAGNOSIS — O368131 Decreased fetal movements, third trimester, fetus 1: Secondary | ICD-10-CM

## 2015-06-28 LAB — AMNISURE RUPTURE OF MEMBRANE (ROM) NOT AT ARMC: Amnisure ROM: NEGATIVE

## 2015-06-28 NOTE — MAU Provider Note (Signed)
  History  29 yo [id number] @ 37.6 wks presents unannounced to MAU w/ c/o no FM since 10 pm last evening. Normally feels baby move all night. Admits to drinking water prior to arrival w/o success. Has noted faint movements since arrival. Also reports watery discharge for the past few days and irregular ctxs. No VB.  Patient Active Problem List   Diagnosis Date Noted  . Decreased fetal movement 06/28/2015  . History of preterm delivery (IOL at 36.1 wks due to oligo) 06/28/2015  . Irregular contractions 06/28/2015    Chief Complaint  Patient presents with  . Decreased Fetal Movement   HPI As above OB History    Gravida Para Term Preterm AB TAB SAB Ectopic Multiple Living   Past Medical History  Diagnosis Date  . Medical history non-contributory     Past Surgical History  Procedure Laterality Date  . No past surgeries      Family History  Problem Relation Age of Onset  . Hypertension Mother   . Diabetes Father     Social History  Substance Use Topics  . Smoking status: Former Games developer  . Smokeless tobacco: Never Used  . Alcohol Use: No    Allergies: No Known Allergies  No prescriptions prior to admission    ROS  Decreased FM Ctxs Possible LOF - VB Physical Exam   Blood pressure 114/73, pulse 74, temperature 98.5 F (36.9 C), temperature source Oral, resp. rate 16, height 5' (1.524 m), weight 64.071 kg (141 lb 4 oz), SpO2 100 %, unknown if currently breastfeeding.    Physical Exam Gen: NAD Abdomen: gravid, soft, NT, no guarding or rebound Pelvic: 1/long/posterior ---- provider not aware of pt's c/o watery d/c until after VE completed, therefore amnisure collected w/ results as noted below. ED Course  Assessment: R/O labor Decreased FM  Plan: Prolonged monitoring Amnisure   Sherre Scarlet CNM, MS 06/28/2015 07:00 AM  ADDENDUM:  Results for orders placed or performed during the hospital encounter of 06/28/15 (from the past  24 hour(s))  Amnisure rupture of membrane (rom)not at [facility]     Status: None   Collection Time: 06/28/15  7:10 AM  Result Value Ref Range   Amnisure ROM NEGATIVE    P: Report given to oncoming provider, Sabas Sous, CNM, for continuation of care.   Sherre Scarlet, CNM 06/28/15, 7:15 AM

## 2015-06-28 NOTE — MAU Note (Signed)
Woke from sleep at 5 am and realized hadn't felt baby move, last time was 10 pm.  Drank water and didn't feel any movement.  Thinks now she feels faint movements. No bleeding.  Watery clear discharge for a few days.

## 2015-06-28 NOTE — Discharge Instructions (Signed)

## 2015-07-02 ENCOUNTER — Inpatient Hospital Stay (HOSPITAL_COMMUNITY): Payer: BLUE CROSS/BLUE SHIELD | Admitting: Anesthesiology

## 2015-07-02 ENCOUNTER — Inpatient Hospital Stay (HOSPITAL_COMMUNITY)
Admission: AD | Admit: 2015-07-02 | Discharge: 2015-07-04 | DRG: 775 | Disposition: A | Discharge status: Home / Self Care | Payer: BLUE CROSS/BLUE SHIELD | Source: Ambulatory Visit | Attending: Obstetrics and Gynecology | Admitting: Obstetrics and Gynecology

## 2015-07-02 ENCOUNTER — Encounter (HOSPITAL_COMMUNITY): Payer: Self-pay | Admitting: *Deleted

## 2015-07-02 DIAGNOSIS — D649 Anemia, unspecified: Secondary | ICD-10-CM | POA: Diagnosis present

## 2015-07-02 DIAGNOSIS — O9902 Anemia complicating childbirth: Secondary | ICD-10-CM | POA: Diagnosis present

## 2015-07-02 DIAGNOSIS — O99214 Obesity complicating childbirth: Secondary | ICD-10-CM | POA: Diagnosis present

## 2015-07-02 DIAGNOSIS — E669 Obesity, unspecified: Secondary | ICD-10-CM | POA: Diagnosis present

## 2015-07-02 DIAGNOSIS — Z87891 Personal history of nicotine dependence: Secondary | ICD-10-CM

## 2015-07-02 DIAGNOSIS — Z8249 Family history of ischemic heart disease and other diseases of the circulatory system: Secondary | ICD-10-CM

## 2015-07-02 DIAGNOSIS — Z833 Family history of diabetes mellitus: Secondary | ICD-10-CM

## 2015-07-02 DIAGNOSIS — B951 Streptococcus, group B, as the cause of diseases classified elsewhere: Secondary | ICD-10-CM | POA: Diagnosis present

## 2015-07-02 DIAGNOSIS — Z3A38 38 weeks gestation of pregnancy: Secondary | ICD-10-CM | POA: Diagnosis present

## 2015-07-02 DIAGNOSIS — O99824 Streptococcus B carrier state complicating childbirth: Secondary | ICD-10-CM | POA: Diagnosis present

## 2015-07-02 LAB — CBC
HEMATOCRIT: 31.9 % — AB (ref 36.0–46.0)
Hemoglobin: 10.4 g/dL — ABNORMAL LOW (ref 12.0–15.0)
MCH: 28 pg (ref 26.0–34.0)
MCHC: 32.6 g/dL (ref 30.0–36.0)
MCV: 85.8 fL (ref 78.0–100.0)
PLATELETS: 210 10*3/uL (ref 150–400)
RBC: 3.72 MIL/uL — ABNORMAL LOW (ref 3.87–5.11)
RDW: 14.2 % (ref 11.5–15.5)
WBC: 10 10*3/uL (ref 4.0–10.5)

## 2015-07-02 LAB — TYPE AND SCREEN
ABO/RH(D): A POS
ANTIBODY SCREEN: NEGATIVE

## 2015-07-02 LAB — RPR: RPR Ser Ql: NONREACTIVE

## 2015-07-02 LAB — ABO/RH: ABO/RH(D): A POS

## 2015-07-02 MED ORDER — OXYCODONE-ACETAMINOPHEN 5-325 MG PO TABS
2.0000 | ORAL_TABLET | ORAL | Status: DC | PRN
Start: 2015-07-02 — End: 2015-07-04

## 2015-07-02 MED ORDER — IBUPROFEN 600 MG PO TABS
600.0000 mg | ORAL_TABLET | Freq: Four times a day (QID) | ORAL | Status: DC
  Administered 2015-07-02 – 2015-07-04 (×8): 600 mg via ORAL
  Filled 2015-07-02 (×8): qty 1

## 2015-07-02 MED ORDER — TETANUS-DIPHTH-ACELL PERTUSSIS 5-2.5-18.5 LF-MCG/0.5 IM SUSP
0.5000 mL | Freq: Once | INTRAMUSCULAR | Status: AC
  Administered 2015-07-04: 0.5 mL via INTRAMUSCULAR
  Filled 2015-07-02: qty 0.5

## 2015-07-02 MED ORDER — ACETAMINOPHEN 325 MG PO TABS
650.0000 mg | ORAL_TABLET | ORAL | Status: DC | PRN
Start: 2015-07-02 — End: 2015-07-02

## 2015-07-02 MED ORDER — SODIUM CHLORIDE 0.9 % IV SOLN
2.0000 g | Freq: Once | INTRAVENOUS | Status: AC
  Administered 2015-07-02: 2 g via INTRAVENOUS
  Filled 2015-07-02: qty 2000

## 2015-07-02 MED ORDER — LACTATED RINGERS IV SOLN
500.0000 mL | INTRAVENOUS | Status: DC | PRN
  Administered 2015-07-02: 1000 mL via INTRAVENOUS

## 2015-07-02 MED ORDER — OXYTOCIN BOLUS FROM INFUSION
500.0000 mL | INTRAVENOUS | Status: DC
  Administered 2015-07-02: 500 mL via INTRAVENOUS

## 2015-07-02 MED ORDER — ONDANSETRON HCL 4 MG/2ML IJ SOLN: 4.0000 mg | INTRAMUSCULAR | Status: DC | PRN

## 2015-07-02 MED ORDER — ACETAMINOPHEN 325 MG PO TABS: 650.0000 mg | ORAL_TABLET | ORAL | Status: DC | PRN

## 2015-07-02 MED ORDER — OXYCODONE-ACETAMINOPHEN 5-325 MG PO TABS: 1.0000 | ORAL_TABLET | ORAL | Status: DC | PRN

## 2015-07-02 MED ORDER — ZOLPIDEM TARTRATE 5 MG PO TABS: 5.0000 mg | ORAL_TABLET | Freq: Every evening | ORAL | Status: DC | PRN

## 2015-07-02 MED ORDER — SIMETHICONE 80 MG PO CHEW: 80.0000 mg | CHEWABLE_TABLET | ORAL | Status: DC | PRN

## 2015-07-02 MED ORDER — LACTATED RINGERS IV SOLN
INTRAVENOUS | Status: DC
  Administered 2015-07-02 (×2): via INTRAVENOUS

## 2015-07-02 MED ORDER — PHENYLEPHRINE 40 MCG/ML (10ML) SYRINGE FOR IV PUSH (FOR BLOOD PRESSURE SUPPORT)
80.0000 ug | PREFILLED_SYRINGE | INTRAVENOUS | Status: DC | PRN
  Filled 2015-07-02 (×2): qty 20

## 2015-07-02 MED ORDER — LANOLIN HYDROUS EX OINT: TOPICAL_OINTMENT | CUTANEOUS | Status: DC | PRN

## 2015-07-02 MED ORDER — DIBUCAINE 1 % RE OINT
1.0000 "application " | TOPICAL_OINTMENT | RECTAL | Status: DC | PRN
  Filled 2015-07-02: qty 28

## 2015-07-02 MED ORDER — LIDOCAINE HCL (PF) 1 % IJ SOLN
INTRAMUSCULAR | Status: DC | PRN
  Administered 2015-07-02: 5 mL via EPIDURAL
  Administered 2015-07-02: 3 mL via EPIDURAL
  Administered 2015-07-02: 2 mL via EPIDURAL

## 2015-07-02 MED ORDER — WITCH HAZEL-GLYCERIN EX PADS: 1.0000 "application " | MEDICATED_PAD | CUTANEOUS | Status: DC | PRN

## 2015-07-02 MED ORDER — FENTANYL 2.5 MCG/ML BUPIVACAINE 1/10 % EPIDURAL INFUSION (WH - ANES)
INTRAMUSCULAR | Status: AC
  Administered 2015-07-02: 08:00:00
  Filled 2015-07-02: qty 125

## 2015-07-02 MED ORDER — EPHEDRINE 5 MG/ML INJ: 10.0000 mg | INTRAVENOUS | Status: DC | PRN

## 2015-07-02 MED ORDER — ONDANSETRON HCL 4 MG PO TABS: 4.0000 mg | ORAL_TABLET | ORAL | Status: DC | PRN

## 2015-07-02 MED ORDER — OXYCODONE-ACETAMINOPHEN 5-325 MG PO TABS: 2.0000 | ORAL_TABLET | ORAL | Status: DC | PRN

## 2015-07-02 MED ORDER — SENNOSIDES-DOCUSATE SODIUM 8.6-50 MG PO TABS
2.0000 | ORAL_TABLET | ORAL | Status: DC
  Administered 2015-07-03 (×2): 2 via ORAL
  Filled 2015-07-02 (×3): qty 2

## 2015-07-02 MED ORDER — DIPHENHYDRAMINE HCL 25 MG PO CAPS: 25.0000 mg | ORAL_CAPSULE | Freq: Four times a day (QID) | ORAL | Status: DC | PRN

## 2015-07-02 MED ORDER — CITRIC ACID-SODIUM CITRATE 334-500 MG/5ML PO SOLN
30.0000 mL | ORAL | Status: DC | PRN
  Filled 2015-07-02: qty 15

## 2015-07-02 MED ORDER — ONDANSETRON HCL 4 MG/2ML IJ SOLN: 4.0000 mg | Freq: Four times a day (QID) | INTRAMUSCULAR | Status: DC | PRN

## 2015-07-02 MED ORDER — DIPHENHYDRAMINE HCL 50 MG/ML IJ SOLN: 12.5000 mg | INTRAMUSCULAR | Status: DC | PRN

## 2015-07-02 MED ORDER — FENTANYL 2.5 MCG/ML BUPIVACAINE 1/10 % EPIDURAL INFUSION (WH - ANES)
14.0000 mL/h | INTRAMUSCULAR | Status: DC | PRN
Start: 2015-07-02 — End: 2015-07-02
  Administered 2015-07-02 (×2): 14 mL/h via EPIDURAL
  Filled 2015-07-02: qty 125

## 2015-07-02 MED ORDER — OXYTOCIN 40 UNITS IN LACTATED RINGERS INFUSION - SIMPLE MED
62.5000 mL/h | INTRAVENOUS | Status: DC
  Administered 2015-07-02: 62.5 mL/h via INTRAVENOUS
  Filled 2015-07-02: qty 1000

## 2015-07-02 MED ORDER — PRENATAL MULTIVITAMIN CH
1.0000 | ORAL_TABLET | Freq: Every day | ORAL | Status: DC
  Administered 2015-07-03 – 2015-07-04 (×2): 1 via ORAL
  Filled 2015-07-02 (×2): qty 1

## 2015-07-02 MED ORDER — LIDOCAINE HCL (PF) 1 % IJ SOLN
30.0000 mL | INTRAMUSCULAR | Status: DC | PRN
  Filled 2015-07-02: qty 30

## 2015-07-02 MED ORDER — TERBUTALINE SULFATE 1 MG/ML IJ SOLN
INTRAMUSCULAR | Status: AC
  Filled 2015-07-02: qty 1

## 2015-07-02 MED ORDER — BENZOCAINE-MENTHOL 20-0.5 % EX AERO
1.0000 "application " | INHALATION_SPRAY | CUTANEOUS | Status: DC | PRN
  Filled 2015-07-02: qty 56

## 2015-07-02 NOTE — H&P (Signed)
Admission History and Physical Exam for an Obstetrics Patient  Hannah Burch is a 29 y.o. female, [id number], at [redacted]w[redacted]d gestation, who presents for admission because of labor. The patient reports she began having contractions early this morning. She denies rupture membranes. She denies bleeding. She has been followed at the [facility] and Gynecology division of [facility].  Her pregnancy has been complicated by anemia, obesity, a history of a 36 week delivery, and a positive beta strep test in the third trimester. See history below.  OB History    Gravida Para Term Preterm AB TAB SAB Ectopic Multiple Living   0 0 2      Past Medical History  Diagnosis Date  . Medical history non-contributory     Prescriptions prior to admission  Medication Sig Dispense Refill Last Dose  . Prenatal Vit-Fe Fumarate-FA (PRENATAL MULTIVITAMIN) TABS tablet Take 1 tablet by mouth daily at 12 noon.   Past Week at Unknown time    Past Surgical History  Procedure Laterality Date  . No past surgeries      No Known Allergies  Family History: family history includes Diabetes in her father; Hypertension in her mother.  Social History:  reports that she has quit smoking. She has never used smokeless tobacco. She reports that she does not drink alcohol or use illicit drugs.  Review of systems: Normal pregnancy complaints.  Admission Physical Exam:  Dilation: 6 Effacement (%): 80 Station: -1 Exam by:: DCALLAWAY, RN Body mass index is 26.98 kg/(m^2).  Blood pressure 113/68, pulse 83, temperature 98.3 F (36.8 C), temperature source Oral, resp. rate 20, height 5' (1.524 m), weight 138 lb 2 oz (62.653 kg), unknown if currently breastfeeding.  HEENT:                 Within normal limits Chest:                   Clear Heart:                    Regular rate and rhythm Abdomen:             Gravid and nontender Extremities:          Grossly  normal Neurologic exam: Grossly normal Pelvic exam:         Cervix: 6 cm  Fetal heart rate tracing: Category 1  Prenatal labs: ABO, Rh:             A/Positive/-- (01/20 0000) HBsAg:                 Negative (01/20 0000)  HIV:                       Non-reactive (01/20 0000)  GBS:                     Positive (08/22 0000) Antibody:              Negative (01/20 0000) Rubella:                  immune RPR:                    Nonreactive (01/20 0000)      Prenatal Transfer Tool  Maternal Diabetes: No Genetic Screening: Normal Maternal Ultrasounds/Referrals: Normal Fetal Ultrasounds or other Referrals:  None Maternal Substance Abuse:  No Significant Maternal Medications:  None Significant Maternal Lab Results:  Positive beta strep in the third trimester Other Comments:  None  Assessment:  [redacted]w[redacted]d gestation  Anemia  Obesity  Positive beta strep screen in the third trimester  Active labor  Plan:  Admit.  Epidural as needed.  Anticipate a vaginal delivery.    Parth Mccormac V  07/02/2015  07/02/2015, 9:03 AM

## 2015-07-02 NOTE — Progress Notes (Signed)
Pt called out to use restroom. Pt still could not move right leg, used stedy with tech assistance to restroom. After pt used restroom, back to stedy, pt lost consciousness for about 30 seconds. Alert and oriented when consciousness regained. VS checked and WNL. Assisted back to bed. Family member in room with her, encouraged to call out if she feels faint with questions/concerns. Sherald Barge

## 2015-07-02 NOTE — Progress Notes (Signed)
Hannah Burch MRN: [id number]  Subjective: -Patient continues to report intermittent vaginal pressure.     Objective: BP 116/71 mmHg  Pulse 70  Temp(Src) 98.2 F (36.8 C) (Oral)  Resp 20  Ht 5' (1.524 m)  Wt 62.653 kg (138 lb 2 oz)  BMI 26.98 kg/m2  SpO2 100%   Total I/O In: -  Out: 200 [Urine:200] FHT: 145 bpm, Min Var, -Decels, +Accels UC: Q3-64min, palpates strong   SVE:   Dilation: 9 Effacement (%): 90 Station: 0 Exam by:: Dr. Stefano Gaul Membranes: AROM Pitocin:None  Assessment:  IUP at 38.2wks Cat I FT  Transitional Labor  Plan: -Place in high fowlers to allow for fetal descent and rotation -Discontinue O2 -Will continue to monitor -Continue other mgmt as ordered   Hannah Rehberg LYNN,MSN, CNM 07/02/2015, 1:49 PM

## 2015-07-02 NOTE — Progress Notes (Signed)
Hannah Burch MRN: [id number]  Subjective: -Patient resting in bed.  Reports some intermittent vaginal pressure.  Family at bedside.   Objective: BP 90/75 mmHg  Pulse 103  Temp(Src) 98.2 F (36.8 C) (Oral)  Resp 20  Ht 5' (1.524 m)  Wt 62.653 kg (138 lb 2 oz)  BMI 26.98 kg/m2  SpO2 100%   Total I/O In: -  Out: 200 [Urine:200] FHT: 145 bpm, Mod Var, +Variable/Early Decels, +Accels UC: Q3-34min, palpates moderate   SVE:   Dilation: 8 Effacement (%): 80 Station: -1 Exam by:: Gerrit Heck, CNM Membranes:AROM of moderate amt clear fluid Pitocin:None  Assessment:  IUP at 38.2wks Cat II FT  Transitional Labor Amniotomy GBS Positive  Plan: -Amniotomy performed without issues -Position change to resolve Cat II FT -Continue other mgmt as ordered -Anticipate SVD  Hannah Sisney LYNN,MSN, CNM 07/02/2015, 11:54 PM   Addendum: 1201:Noted prolonged deceleration.  In room to assess. Fluid bolus infusing, O2 on via rebreather.  SVE: 9/90/0.  Dr. AVS at bedside.  IUPC and FSE applied by Dr. AVS.  FHR returned to baseline of 150s.  Questions and concerns addressed.  Will continue to monitor.  Anticipate SVD   Hannah Burch  07/02/2015 12:36 PM

## 2015-07-02 NOTE — Progress Notes (Signed)
Hannah Burch MRN: [id number]  Subjective: -Patient resting in bed.  Comfortable with epidural. Family at bedside.   Objective: BP 113/68 mmHg  Pulse 83  Temp(Src) 98.3 F (36.8 C) (Oral)  Resp 20  Ht 5' (1.524 m)  Wt 62.653 kg (138 lb 2 oz)  BMI 26.98 kg/m2     FHT: 135 bpm, Mod Var, -Decels, +Accels UC: Q3-83min, palpates strong   SVE:   Dilation: 6 Effacement (%): 80 Station: -1 Exam by:: DCALLAWAY, RN Membranes:Intact/Bulging Pitocin:None  Assessment:  IUP at 38.2wks Cat I FT  Transitional Labor GBS Positive  Plan: -Discussed AROM, patient reports fear.  Will wait until complete dilation or SROM -Ampicillin infusing -No questions or concerns -Continue other mgmt as ordered  Hannah Pekar LYNN,MSN, CNM 07/02/2015, 8:47 AM

## 2015-07-02 NOTE — MAU Note (Signed)
PT  SAYS S SHE HURT BAD  SINCE 010.   VE IN MAU - AND  OFFICE  2  CM.  DENIES HSV AND MRSA.   GBS- UNSURE

## 2015-07-02 NOTE — Anesthesia Procedure Notes (Signed)
Epidural Patient location during procedure: OB  Staffing Anesthesiologist: Cecile Hearing Performed by: anesthesiologist   Preanesthetic Checklist Completed: patient identified, pre-op evaluation, timeout performed, IV checked, risks and benefits discussed and monitors and equipment checked  Epidural Patient position: sitting Prep: DuraPrep Patient monitoring: blood pressure and continuous pulse ox Approach: midline Location: L3-L4 Injection technique: LOR air  Needle:  Needle type: Tuohy  Needle gauge: 17 G Needle length: 9 cm Needle insertion depth: 4.5 cm Catheter size: 19 Gauge Catheter at skin depth: 10 cm Test dose: negative and Other (1% Lidocaine)  Additional Notes Patient identified.  Risk benefits discussed including failed block, incomplete pain control, headache, nerve damage, paralysis, blood pressure changes, nausea, vomiting, reactions to medication both toxic or allergic, and postpartum back pain.  Patient expressed understanding and wished to proceed.  All questions were answered.  Sterile technique used throughout procedure and epidural site dressed with sterile barrier dressing. No paresthesia or other complications noted. The patient did not experience any signs of intravascular injection such as tinnitus or metallic taste in mouth nor signs of intrathecal spread such as rapid motor block. Please see nursing notes for vital signs. Reason for block:procedure for pain

## 2015-07-02 NOTE — Anesthesia Preprocedure Evaluation (Addendum)
Anesthesia Evaluation  Patient identified by MRN, date of birth, ID band Patient awake    Reviewed: Allergy & Precautions, NPO status , Patient's Chart, lab work & pertinent test results  History of Anesthesia Complications Negative for: history of anesthetic complications  Airway Mallampati: II  TM Distance: >3 FB Neck ROM: Full    Dental  (+) Teeth Intact, Dental Advisory Given   Pulmonary former smoker,  breath sounds clear to auscultation  Pulmonary exam normal       Cardiovascular Exercise Tolerance: Good negative cardio ROS Normal cardiovascular examRhythm:Regular Rate:Normal     Neuro/Psych negative neurological ROS     GI/Hepatic negative GI ROS, Neg liver ROS,   Endo/Other  negative endocrine ROS  Renal/GU negative Renal ROS     Musculoskeletal negative musculoskeletal ROS (+)   Abdominal   Peds  Hematology  (+) Blood dyscrasia, anemia ,   Anesthesia Other Findings Day of surgery medications reviewed with the patient.  Reproductive/Obstetrics (+) Pregnancy                            Anesthesia Physical Anesthesia Plan  ASA: II  Anesthesia Plan: Epidural   Post-op Pain Management:    Induction:   Airway Management Planned:   Additional Equipment:   Intra-op Plan:   Post-operative Plan:   Informed Consent: I have reviewed the patients History and Physical, chart, labs and discussed the procedure including the risks, benefits and alternatives for the proposed anesthesia with the patient or authorized representative who has indicated his/her understanding and acceptance.   Dental advisory given  Plan Discussed with:   Anesthesia Plan Comments: (Patient identified. Risks/Benefits/Options discussed with patient including but not limited to bleeding, infection, nerve damage, paralysis, failed block, incomplete pain control, headache, blood pressure changes, nausea,  vomiting, reactions to medication both or allergic, itching and postpartum back pain. Confirmed with bedside nurse the patient's most recent platelet count. Confirmed with patient that they are not currently taking any anticoagulation, have any bleeding history or any family history of bleeding disorders. Patient expressed understanding and wished to proceed. All questions were answered. )        Anesthesia Quick Evaluation

## 2015-07-03 LAB — CCBB MATERNAL DONOR DRAW

## 2015-07-03 LAB — CBC
HCT: 28.6 % — ABNORMAL LOW (ref 36.0–46.0)
HEMOGLOBIN: 9.4 g/dL — AB (ref 12.0–15.0)
MCH: 28.1 pg (ref 26.0–34.0)
MCHC: 32.9 g/dL (ref 30.0–36.0)
MCV: 85.4 fL (ref 78.0–100.0)
Platelets: 190 10*3/uL (ref 150–400)
RBC: 3.35 MIL/uL — ABNORMAL LOW (ref 3.87–5.11)
RDW: 14.4 % (ref 11.5–15.5)
WBC: 17.7 10*3/uL — ABNORMAL HIGH (ref 4.0–10.5)

## 2015-07-03 NOTE — Lactation Note (Signed)
This note was copied from the chart of Hannah Burch. Lactation Consultation Note; Mom called for assist with latch. Reports some pain with latch. Baby undressed and placed skin to skin in football hold. Baby latched well- mom reports some pain- bottom lip untucked and mom reports that feels better. I showed dad what to look for and how to fix it. Reviewed basic teaching. Mom reports with last baby she did a lot of pumping and made a lot of milk and had problems with engorgement. Reviewed no need for pumping if baby is nursing well. Reviewed engorgement prevention and treatment, Reviewed BF brochure. No further questions at present. To call for assist prn  Patient Name: Hannah Burch [id number] Date: 07/03/2015 Reason for consult: Follow-up assessment   Maternal Data Formula Feeding for Exclusion: No Has patient been taught Hand Expression?: Yes Does the patient have breastfeeding experience prior to this delivery?: Yes  Feeding Feeding Type: Breast Fed Length of feed: 30 min  LATCH Score/Interventions Latch: Grasps breast easily, tongue down, lips flanged, rhythmical sucking.  Audible Swallowing: A few with stimulation  Type of Nipple: Everted at rest and after stimulation  Comfort (Breast/Nipple): Soft / non-tender     Hold (Positioning): Assistance needed to correctly position infant at breast and maintain latch. Intervention(s): Breastfeeding basics reviewed;Support Pillows;Position options  LATCH Score: 8  Lactation Tools Discussed/Used     Consult Status Consult Status: Follow-up Date: 07/04/15 Follow-up type: In-patient    Pamelia Hoit 07/03/2015, 4:40 PM

## 2015-07-03 NOTE — Progress Notes (Addendum)
Subjective: Postpartum Day 1: Vaginal delivery, periurethral laceration Patient up ad lib, reports no syncope or dizziness.  Had syncopal episode on initial ambulation around 7:30pm, but recovered quickly, doing well now. Feeding:  Breast Contraceptive plan:  Considering BTL  Objective: Vital signs in last 24 hours: Temp:  [97.2 F (36.2 C)-98.8 F (37.1 C)] 98.7 F (37.1 C) (09/02 0500) Pulse Rate:  [70-103] 70 (09/02 0500) Resp:  [16-20] 16 (09/02 0500) BP: (90-133)/(50-92) 105/62 mmHg (09/02 0500) SpO2:  [98 %-100 %] 100 % (09/02 0015)  Physical Exam:  General: alert Lochia: appropriate Uterine Fundus: firm Perineum: healing well DVT Evaluation: No evidence of DVT seen on physical exam. Negative Homan's sign.  CBC Latest Ref Rng 07/03/2015 07/02/2015 02/15/2014  WBC 4.0 - 10.5 K/uL 17.7(H) 10.0 6.9  Hemoglobin 12.0 - 15.0 g/dL 1.6(X) 10.4(L) 11.4(L)  Hematocrit 36.0 - 46.0 % 28.6(L) 31.9(L) 34.3(L)  Platelets 150 - 400 K/uL 190 210 229     Assessment/Plan: Status post vaginal delivery day 1 Stable Continue current care. Plan for discharge tomorrow      Nyra Capes 07/03/2015, 11:22 AM

## 2015-07-03 NOTE — Anesthesia Postprocedure Evaluation (Signed)
Anesthesia Post Note  Patient: Hannah Burch  Procedure(s) Performed: * No procedures listed *  Anesthesia type: Epidural  Patient location: Mother/Baby  Post pain: Pain level controlled  Post assessment: Post-op Vital signs reviewed  Last Vitals:  Filed Vitals:   07/03/15 0500  BP: 105/62  Pulse: 70  Temp: 37.1 C  Resp: 16    Post vital signs: Reviewed  Level of consciousness:alert  Complications: No apparent anesthesia complications

## 2015-07-03 NOTE — Discharge Summary (Signed)
  Vaginal Delivery Discharge Summary  Hannah Burch  DOB:    May 09, 1986 MRN:    [id number] CSN:    [id number]  Date of admission:                  07/02/15  Date of discharge:                   07/04/15  Procedures this admission:     Date of Delivery: 07/02/15  Newborn Data:  Live born female  Birth Weight: 7 lb 4.6 oz (3305 g) APGAR: 9, 9  Home with mother. Name: Hannah Burch Circumcision Plan: Inpatient  History of Present Illness:  Ms. Hannah Burch is a 29 y.o. female, [phone], who presents at [redacted]w[redacted]d weeks gestation. The patient has been followed at [facility] and Gynecology division of [facility]. She was admitted for onset of labor. Her pregnancy has been complicated by:  Patient Active Problem List   Diagnosis Date Noted  . SVD (spontaneous vaginal delivery) 07/02/2015  . Laceration of periurethral tissue with delivery 07/02/2015  . Group beta Strep positive 07/02/2015  . Decreased fetal movement 06/28/2015  . History of preterm delivery (IOL at 36.1 wks due to oligo) 06/28/2015  . Irregular contractions 06/28/2015     Hospital Course:   Admitting Dx:  IUP at 38 2/7 weeks, active labor GBS Status:  Positive Delivering Clinician: Gerrit Heck, CNM Lacerations/MLE: Periurethral Complications: None  Intrapartum Procedures: spontaneous vaginal delivery Postpartum Procedures: none Complications-Operative and Postpartum: Periurethral laceration  Discharge Diagnoses: Term Pregnancy-delivered, GBS positive  Feeding:  breast  Contraception:  Considering BTL by 6 weeks.  Hemoglobin Results:  CBC Latest Ref Rng 07/03/2015 07/02/2015 02/15/2014  WBC 4.0 - 10.5 K/uL 17.7(H) 10.0 6.9  Hemoglobin 12.0 - 15.0 g/dL 1.3(Y) 10.4(L) 11.4(L)  Hematocrit 36.0 - 46.0 % 28.6(L) 31.9(L) 34.3(L)  Platelets 150 - 400 K/uL 190 210 229    Discharge Physical Exam:   General: alert Lochia: appropriate Uterine Fundus: firm Incision:  healing well DVT Evaluation: No evidence of DVT seen on physical exam. Negative Homan's sign.   Discharge Information:  Activity:           pelvic rest Diet:                routine Medications: Ibuprofen Condition:      stable Instructions:  Routine pp instructions, BTL   Discharge to: home  Follow-up Information    Follow up with [facility]. Schedule an appointment as soon as possible for a visit in 6 weeks.   Specialty:  Obstetrics and Gynecology   Why:  Call for any questions or concerns.   Contact information:   [address] [place] [address] [phone]       Nigel Bridgeman [facility] 07/04/2015 7:34 AM

## 2015-07-04 MED ORDER — IBUPROFEN 600 MG PO TABS: 600.0000 mg | ORAL_TABLET | Freq: Four times a day (QID) | ORAL | Status: DC | PRN

## 2015-07-04 MED ORDER — INFLUENZA VAC SPLIT QUAD 0.5 ML IM SUSY
0.5000 mL | PREFILLED_SYRINGE | INTRAMUSCULAR | Status: AC
  Administered 2015-07-04: 0.5 mL via INTRAMUSCULAR
  Filled 2015-07-04: qty 0.5

## 2015-07-04 NOTE — Discharge Instructions (Signed)
Postpartum Care After Vaginal Delivery °After you deliver your newborn (postpartum period), the usual stay in the hospital is 24-72 hours. If there were problems with your labor or delivery, or if you have other medical problems, you might be in the hospital longer.  °While you are in the hospital, you will receive help and instructions on how to care for yourself and your newborn during the postpartum period.  °While you are in the hospital: °· Be sure to tell your nurses if you have pain or discomfort, as well as where you feel the pain and what makes the pain worse. °· If you had an incision made near your vagina (episiotomy) or if you had some tearing during delivery, the nurses may put ice packs on your episiotomy or tear. The ice packs may help to reduce the pain and swelling. °· If you are breastfeeding, you may feel uncomfortable contractions of your uterus for a couple of weeks. This is normal. The contractions help your uterus get back to normal size. °· It is normal to have some bleeding after delivery. °· For the first 1-3 days after delivery, the flow is red and the amount may be similar to a period. °· It is common for the flow to start and stop. °· In the first few days, you may pass some small clots. Let your nurses know if you begin to pass large clots or your flow increases. °· Do not  flush blood clots down the toilet before having the nurse look at them. °· During the next 3-10 days after delivery, your flow should become more watery and pink or brown-tinged in color. °· Ten to fourteen days after delivery, your flow should be a small amount of yellowish-white discharge. °· The amount of your flow will decrease over the first few weeks after delivery. Your flow may stop in 6-8 weeks. Most women have had their flow stop by 12 weeks after delivery. °· You should change your sanitary pads frequently. °· Wash your hands thoroughly with soap and water for at least 20 seconds after changing pads, using  the toilet, or before holding or feeding your newborn. °· You should feel like you need to empty your bladder within the first 6-8 hours after delivery. °· In case you become weak, lightheaded, or faint, call your nurse before you get out of bed for the first time and before you take a shower for the first time. °· Within the first few days after delivery, your breasts may begin to feel tender and full. This is called engorgement. Breast tenderness usually goes away within 48-72 hours after engorgement occurs. You may also notice milk leaking from your breasts. If you are not breastfeeding, do not stimulate your breasts. Breast stimulation can make your breasts produce more milk. °· Spending as much time as possible with your newborn is very important. During this time, you and your newborn can feel close and get to know each other. Having your newborn stay in your room (rooming in) will help to strengthen the bond with your newborn.  It will give you time to get to know your newborn and become comfortable caring for your newborn. °· Your hormones change after delivery. Sometimes the hormone changes can temporarily cause you to feel sad or tearful. These feelings should not last more than a few days. If these feelings last longer than that, you should talk to your caregiver. °· If desired, talk to your caregiver about methods of family planning or contraception. °·   Talk to your caregiver about immunizations. Your caregiver may want you to have the following immunizations before leaving the hospital:  Tetanus, diphtheria, and pertussis (Tdap) or tetanus and diphtheria (Td) immunization. It is very important that you and your family (including grandparents) or others caring for your newborn are up-to-date with the Tdap or Td immunizations. The Tdap or Td immunization can help protect your newborn from getting ill.  Rubella immunization.  Varicella (chickenpox) immunization.  Influenza immunization. You should  receive this annual immunization if you did not receive the immunization during your pregnancy. Document Released: 08/14/2007 Document Revised: 07/11/2012 Document Reviewed: 06/13/2012 [facility] Patient Information 2015 [place], [place]. This information is not intended to replace advice given to you by your health care provider. Make sure you discuss any questions you have with your health care provider.  Sterilization Information, Female Female sterilization is a procedure to permanently prevent pregnancy. There are different ways to perform sterilization, but all either block or close the fallopian tubes so that your eggs cannot reach your uterus. If your egg cannot reach your uterus, sperm cannot fertilize the egg, and you cannot get pregnant.  Sterilization is performed by a surgical procedure. Sometimes these procedures are performed in a hospital while a patient is asleep. Sometimes they can be done in a clinic setting with the patient awake. The fallopian tubes can be surgically cut, tied, or sealed through a procedure called tubal ligation. The fallopian tubes can also be closed with clips or rings. Sterilization can also be done by placing a tiny coil into each fallopian tube, which causes scar tissue to grow inside the tube. The scar tissue then blocks the tubes.  Discuss sterilization with your caregiver to answer any concerns you or your partner may have. You may want to ask what type of sterilization your caregiver performs. Some caregivers may not perform all the various options. Sterilization is permanent and should only be done if you are sure you do not want children or do not want any more children. Having a sterilization reversed may not be successful.  STERILIZATION PROCEDURES  Laparoscopic sterilization. This is a surgical method performed at a time other than right after childbirth. Two incisions are made in the lower abdomen. A thin, lighted tube (laparoscope) is inserted into one of  the incisions and is used to perform the procedure. The fallopian tubes are closed with a ring or a clip. An instrument that uses heat could be used to seal the tubes closed (electrocautery).   Mini-laparotomy. This is a surgical method done 1 or 2 days after giving birth. Typically, a small incision is made just below the belly button (umbilicus) and the fallopian tubes are exposed. The tubes can then be sealed, tied, or cut.   Hysteroscopic sterilization. This is performed at a time other than right after childbirth. A tiny, spring-like coil is inserted through the cervix and uterus and placed into the fallopian tubes. The coil causes scaring and blocks the tubes. Other forms of contraception should be used for 3 months after the procedure to allow the scar tissue to form completely. Additionally, it is required hysterosalpingography be done 3 months later to ensure that the procedure was successful. Hysterosalpingography is a procedure that uses X-rays to look at your uterus and fallopian tubes after a material to make them show up better has been inserted. IS STERILIZATION SAFE? Sterilization is considered safe with very rare complications. Risks depend on the type of procedure you have. As with any surgical  procedure, there are risks. Some risks of sterilization by any means include:   Bleeding.  Infection.  Reaction to anesthesia medicine.  Injury to surrounding organs. Risks specific to having hysteroscopic coils placed include:  The coils may not be placed correctly the first time.   The coils may move out of place.   The tubes may not get completely blocked after 3 months.   Injury to surrounding organs when placing the coil.  HOW EFFECTIVE IS FEMALE STERILIZATION? Sterilization is nearly 100% effective, but it can fail. Depending on the type of sterilization, the rate of failure can be as high as 3%. After hysteroscopic sterilization with placement of fallopian tube coils,  you will need back-up birth control for 3 months after the procedure. Sterilization is effective for a lifetime.  BENEFITS OF STERILIZATION  It does not affect your hormones, and therefore will not affect your menstrual periods, sexual desire, or performance.   It is effective for a lifetime.   It is safe.   You do not need to worry about getting pregnant. Keep in mind that if you had the hysteroscopic placement procedure, you must wait 3 months after the procedure (or until your caregiver confirms) before pregnancy is not considered possible.   There are no side effects unlike other types of birth control (contraception).  DRAWBACKS OF STERILIZATION  You must be sure you do not want children or any more children. The procedure is permanent.   It does not provide protection against sexually transmitted infections (STIs).   The tubes can grow back together. If this happens, there is a risk of pregnancy. There is also an increased risk (50%) of pregnancy being an ectopic pregnancy. This is a pregnancy that happens outside of the uterus. Document Released: 04/04/2008 Document Revised: 10/22/2013 Document Reviewed: 02/02/2012 [employer] Patient Information 2015 [place], [place]. This information is not intended to replace advice given to you by your health care provider. Make sure you discuss any questions you have with your health care provider.

## 2015-07-04 NOTE — Lactation Note (Signed)
This note was copied from the chart of Hannah Burch. Lactation Consultation Note States baby cluster feeding. Has postional stripe to Rt. Nipple. Comfort gels given. Hand expression noted filling breast w/some knots and transitional milk coming in. Mom wearing supportive bra. Gave her ICE bag to take home to apply for engorgement, information paper on engorgement, and reviewed information on prevention, maintenance, and relief of engorgement.  Encouraged breast massage, frequent BF, and pumping if not relieved by BF. Dicussed obtaining deep latch and wider flange, chin tug.mom states he get on then clamps at intervals. Reminded of LC services if needed. Patient Name: Hannah Sibel Khurana [id number] Date: 07/04/2015 Reason for consult: Follow-up assessment;Breast/nipple pain   Maternal Data    Feeding Feeding Type: Breast Fed Length of feed: 20 min  LATCH Score/Interventions       Type of Nipple: Everted at rest and after stimulation  Comfort (Breast/Nipple): Filling, red/small blisters or bruises, mild/mod discomfort  Problem noted: Mild/Moderate discomfort;Cracked, bleeding, blisters, bruises;Filling Interventions (Filling): Massage;Hand pump;Frequent nursing;Firm support Interventions  (Cracked/bleeding/bruising/blister): Hand pump;Expressed breast milk to nipple Interventions (Mild/moderate discomfort): Comfort gels;Post-pump;Hand massage;Hand expression  Intervention(s): Position options;Skin to skin;Support Pillows;Breastfeeding basics reviewed     Lactation Tools Discussed/Used Tools: Pump Breast pump type: Manual Pump Review: Setup, frequency, and cleaning;Milk Storage Initiated by:: RN Date initiated:: 07/03/15   Consult Status Consult Status: Complete Date: 07/04/15    Charyl Dancer 07/04/2015, 10:29 AM

## 2016-10-04 ENCOUNTER — Encounter (HOSPITAL_COMMUNITY): Payer: Self-pay | Admitting: Emergency Medicine

## 2016-10-04 ENCOUNTER — Ambulatory Visit (HOSPITAL_COMMUNITY)
Admission: EM | Admit: 2016-10-04 | Discharge: 2016-10-04 | Disposition: A | Discharge status: Home / Self Care | Payer: 59 | Attending: Family Medicine | Admitting: Family Medicine

## 2016-10-04 ENCOUNTER — Telehealth (HOSPITAL_COMMUNITY): Payer: Self-pay | Admitting: Emergency Medicine

## 2016-10-04 DIAGNOSIS — F439 Reaction to severe stress, unspecified: Secondary | ICD-10-CM

## 2016-10-04 DIAGNOSIS — J111 Influenza due to unidentified influenza virus with other respiratory manifestations: Secondary | ICD-10-CM

## 2016-10-04 LAB — POCT PREGNANCY, URINE: Preg Test, Ur: NEGATIVE

## 2016-10-04 NOTE — ED Provider Notes (Signed)
CSN: [id number]     Arrival date & time 10/04/16  1007 History   None    Chief Complaint  Patient presents with  . URI    HPI  Ms. Hannah Burch is a 30yo female with no significant PMH and not on regular medications.  Patient reports of flu like symptoms consisting of whole body aches, chills, sweating but feeling cold which started suddenly last night. Also notes of frontal HA daily for the past week; HA is associated with slight light sensitivity but no phonophobia or radiation of pain. No neck pain/rigidity. HA is aggravated with activity (for example when playing with kids). Unsure if HA is intermittent. Headache does not wake her up from sleep Reports she usually does not have headaches. Reports of mild nausea that began this morning.  No rhinorrhea, cough, nasal congestion, no sore throat, shortness of breath, vomiting, diarrhea. She reports of Tmax of 100.27F last night around 8 pm. She has been using ibuprofen (2 doses) for her HA, with last dose this morning. No sick contacts. Did not receive flu vaccine this season.  Reports that she had episode of numbness in her legs bilaterally which she noticed while walking last night and this self-resolved. She also reports of some bruising/rash on her legs that waxes and wanes and changes location. No trauma to her recollection; no bug bites/activity in the woods.   Reports she has been under some stress lately. 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  Is sexually active with her husband.   Past Medical History:  Diagnosis Date  . Medical history non-contributory    Past Surgical History:  Procedure Laterality Date  . NO PAST SURGERIES     Family History  Problem Relation Age of Onset  . Hypertension Mother   . Diabetes Father    Social History  Substance Use Topics  . Smoking status: Former Games developermoker  . Smokeless tobacco: Never Used  . Alcohol use No   OB History    Gravida Para Term Preterm AB Living   5 3  2 1 2 3    SAB TAB Ectopic Multiple Live Births   1 1 0 0 3     Review of Systems  Constitutional: Positive for chills and fever.  HENT: Negative for postnasal drip, rhinorrhea, sinus pain and sinus pressure.   Eyes: Negative for visual disturbance.  Respiratory: Negative for shortness of breath.   Cardiovascular: Negative for chest pain.  Gastrointestinal: Positive for nausea. Negative for diarrhea and vomiting.  Genitourinary:       Reports of regular menstrual periods. Last period last month. She is not late this month    Allergies  Patient has no known allergies.  Home Medications   Prior to Admission medications   Medication Sig Start Date End Date Taking? Authorizing Provider  ibuprofen (ADVIL,MOTRIN) 600 MG tablet Take 1 tablet (600 mg total) by mouth every 6 (six) hours as needed. 07/04/15  Yes Nigel BridgemanVicki Latham, CNM  Prenatal Vit-Fe Fumarate-FA (PRENATAL MULTIVITAMIN) TABS tablet Take 1 tablet by mouth daily at 12 noon.    Historical Provider, MD   Meds Ordered and Administered this Visit  Medications - No data to display  BP 109/74 (BP Location: Left Arm)   Pulse 90   Temp 99.8 F (37.7 C) (Oral)   Resp 18   LMP 09/20/2016   SpO2 100%  No data found.   Physical Exam  Constitutional: She is oriented to person,  place, and time. She appears well-developed and well-nourished. No distress.  HENT:  Right Ear: External ear normal.  Left Ear: External ear normal.  Nose: Nose normal.  Mouth/Throat: Oropharynx is clear and moist.  Eyes: Conjunctivae and EOM are normal. Pupils are equal, round, and reactive to light.  Neck: Normal range of motion. Neck supple.  Cardiovascular: Normal rate and regular rhythm.  Exam reveals no gallop and no friction rub.   No murmur heard. Pulmonary/Chest: Effort normal and breath sounds normal.  Musculoskeletal: She exhibits no edema or tenderness.  Lymphadenopathy:    She has no cervical adenopathy.  Neurological: She is alert and  oriented to person, place, and time. She displays normal reflexes. No cranial nerve deficit or sensory deficit. She exhibits normal muscle tone. Coordination normal.  Skin: Skin is warm and dry.  Psychiatric: She has a normal mood and affect.    Urgent Care Course   Clinical Course   Patient's symptoms of headaches and body aches initially thought to be stress related as patient did not have true fever on initial visit. Patient returned to urgent care and was found to have temp of 103F. Therefore, this is likely due to influenza.  Urine pregnancy is negative. Will phone in Tamiflu 75mg  BID x 5 days. Return precautions reviewed. Discussed assessment and plan with Dr. Melrose NakayamaKindle.    Procedures   Labs Review Labs Reviewed  POCT PREGNANCY, URINE   Imaging Review No results found.  MDM   1. Influenza   Patient's symptoms of headaches and body aches initially thought to be stress related as patient did not have true fever on initial visit. Patient returned to urgent care and was found to have temp of 103F. Therefore, this is likely due to influenza.  Urine pregnancy is negative. Will phone in Tamiflu 75mg  BID x 5 days. Return precautions reviewed. Discussed assessment and plan with Dr. Melrose NakayamaKindle.     Palma HolterKanishka G Gunadasa, MD 10/04/16 1433    Palma HolterKanishka G Gunadasa, MD 10/04/16 [id number]

## 2016-10-04 NOTE — ED Triage Notes (Addendum)
Onset yesterday of hot spells and chills.  Patient denies any uri symptoms.  Reports general body aches.  Patient also complains of a headache

## 2016-10-04 NOTE — Discharge Instructions (Addendum)
Your urine pregnancy test was negative. Your symptoms were initially thought to be due to stress since you did not have a fever. But when you returned to our urgent care, you did have a fever. Therefore we will treat you for what is likely the flu. I called in Tamiflu to the pharmacy.

## 2016-10-04 NOTE — Telephone Encounter (Signed)
Patient has returned to [facility] for reevaluation.  Patient says she is feeling worse again, like she did last night.  Current vital signs are 120/71, 100, 117, 103.1.  Patient says she took tylenol and ibuprofen at 4 pm today.  When asked how much, patient initially said "i dont know".  Then patient said she took one of each.  Notified dr Artis Flockkindl.  Dr Artis Flockkindl and resident reevaluated patient chart and considered current vital signs.

## 2016-10-31 NOTE — L&D Delivery Note (Signed)
Delivery Note Pt with prolonged decel, foud to be 10./100/+2-3, pushed for 3-4 ctx for delivery.  At 7:41 AM a viable and healthy female was delivered via Vaginal, Spontaneous Delivery (Presentation: OA, LOT).  APGAR: 9, 9; weight  P.   Placenta status: delivered, intact .  Cord: 3V with the following complications: nuchal.    Anesthesia:  epidural Episiotomy: None Lacerations: None Suture Repair: N/A Est. Blood Loss (mL): 200  Mom to postpartum.  Baby to Couplet care / Skin to Skin.  Hannah Burch 07/30/2017, 7:57 AM  Br/A+/RI/Tdap in PNC/PP BTL  D/w pt r/b/a of PP BTL, wishes to proceed.   Will proceed after recovery.

## 2017-01-24 LAB — OB RESULTS CONSOLE ABO/RH: RH Type: POSITIVE

## 2017-01-24 LAB — OB RESULTS CONSOLE RPR: RPR: NONREACTIVE

## 2017-01-24 LAB — OB RESULTS CONSOLE HEPATITIS B SURFACE ANTIGEN: Hepatitis B Surface Ag: NEGATIVE

## 2017-01-24 LAB — OB RESULTS CONSOLE HIV ANTIBODY (ROUTINE TESTING): HIV: NONREACTIVE

## 2017-01-24 LAB — OB RESULTS CONSOLE ANTIBODY SCREEN: ANTIBODY SCREEN: NEGATIVE

## 2017-01-24 LAB — OB RESULTS CONSOLE RUBELLA ANTIBODY, IGM: RUBELLA: IMMUNE

## 2017-01-24 LAB — OB RESULTS CONSOLE GC/CHLAMYDIA
CHLAMYDIA, DNA PROBE: NEGATIVE
GC PROBE AMP, GENITAL: NEGATIVE

## 2017-06-19 ENCOUNTER — Encounter (HOSPITAL_COMMUNITY): Payer: Self-pay | Admitting: *Deleted

## 2017-06-19 ENCOUNTER — Inpatient Hospital Stay (HOSPITAL_COMMUNITY): Payer: 59

## 2017-06-19 ENCOUNTER — Inpatient Hospital Stay (HOSPITAL_COMMUNITY)
Admission: AD | Admit: 2017-06-19 | Discharge: 2017-06-19 | Disposition: A | Discharge status: Home / Self Care | Payer: 59 | Source: Ambulatory Visit | Attending: Obstetrics and Gynecology | Admitting: Obstetrics and Gynecology

## 2017-06-19 DIAGNOSIS — O2343 Unspecified infection of urinary tract in pregnancy, third trimester: Secondary | ICD-10-CM | POA: Diagnosis not present

## 2017-06-19 DIAGNOSIS — R8290 Unspecified abnormal findings in urine: Secondary | ICD-10-CM | POA: Diagnosis not present

## 2017-06-19 DIAGNOSIS — R109 Unspecified abdominal pain: Secondary | ICD-10-CM | POA: Diagnosis present

## 2017-06-19 DIAGNOSIS — Z3A33 33 weeks gestation of pregnancy: Secondary | ICD-10-CM | POA: Diagnosis not present

## 2017-06-19 DIAGNOSIS — Z87891 Personal history of nicotine dependence: Secondary | ICD-10-CM | POA: Insufficient documentation

## 2017-06-19 DIAGNOSIS — O9989 Other specified diseases and conditions complicating pregnancy, childbirth and the puerperium: Secondary | ICD-10-CM | POA: Diagnosis not present

## 2017-06-19 LAB — CBC WITH DIFFERENTIAL/PLATELET
BASOS ABS: 0 10*3/uL (ref 0.0–0.1)
BASOS PCT: 0 %
Eosinophils Absolute: 0 10*3/uL (ref 0.0–0.7)
Eosinophils Relative: 0 %
HEMATOCRIT: 30.9 % — AB (ref 36.0–46.0)
HEMOGLOBIN: 10.5 g/dL — AB (ref 12.0–15.0)
LYMPHS PCT: 15 %
Lymphs Abs: 2.2 10*3/uL (ref 0.7–4.0)
MCH: 28.3 pg (ref 26.0–34.0)
MCHC: 34 g/dL (ref 30.0–36.0)
MCV: 83.3 fL (ref 78.0–100.0)
MONOS PCT: 3 %
Monocytes Absolute: 0.5 10*3/uL (ref 0.1–1.0)
NEUTROS ABS: 12.5 10*3/uL — AB (ref 1.7–7.7)
NEUTROS PCT: 82 %
Platelets: 235 10*3/uL (ref 150–400)
RBC: 3.71 MIL/uL — ABNORMAL LOW (ref 3.87–5.11)
RDW: 13.7 % (ref 11.5–15.5)
WBC: 15.2 10*3/uL — ABNORMAL HIGH (ref 4.0–10.5)

## 2017-06-19 LAB — URINALYSIS, ROUTINE W REFLEX MICROSCOPIC
Bilirubin Urine: NEGATIVE
Glucose, UA: NEGATIVE mg/dL
KETONES UR: 20 mg/dL — AB
Nitrite: NEGATIVE
PH: 7 (ref 5.0–8.0)
Protein, ur: NEGATIVE mg/dL
Specific Gravity, Urine: 1.011 (ref 1.005–1.030)

## 2017-06-19 MED ORDER — SODIUM CHLORIDE 0.9 % IV BOLUS (SEPSIS)
1000.0000 mL | Freq: Once | INTRAVENOUS | Status: AC
  Administered 2017-06-19: 1000 mL via INTRAVENOUS

## 2017-06-19 MED ORDER — LACTATED RINGERS IV BOLUS (SEPSIS)
1000.0000 mL | Freq: Once | INTRAVENOUS | Status: AC
  Administered 2017-06-19: 1000 mL via INTRAVENOUS

## 2017-06-19 MED ORDER — NITROFURANTOIN MONOHYD MACRO 100 MG PO CAPS: 100.0000 mg | ORAL_CAPSULE | Freq: Two times a day (BID) | ORAL | 0 refills | Status: DC

## 2017-06-19 MED ORDER — DEXTROSE 5 % IV SOLN
1.0000 g | Freq: Once | INTRAVENOUS | Status: AC
  Administered 2017-06-19: 1 g via INTRAVENOUS
  Filled 2017-06-19: qty 10

## 2017-06-19 MED ORDER — ACETAMINOPHEN 500 MG PO TABS
1000.0000 mg | ORAL_TABLET | Freq: Once | ORAL | Status: AC
  Administered 2017-06-19: 1000 mg via ORAL
  Filled 2017-06-19: qty 2

## 2017-06-19 NOTE — MAU Provider Note (Signed)
History     CSN: [id number]  Arrival date and time: 06/19/17 1109   First Provider Initiated Contact with Patient 06/19/17 1140      Chief Complaint  Patient presents with  . Chills  . Flank Pain   HPI  Ms. Hannah Burch is 31 yo [id number] at 33.[redacted] wks gestation presenting to MAU with complaints of RT flank pain that woke her up at midnight, chills at 0500  and urine with foul odor.  She denies dysuria, VB or LOF.  She also reports feeling weak.  Past Medical History:  Diagnosis Date  . Medical history non-contributory   . Preterm delivery     Past Surgical History:  Procedure Laterality Date  . NO PAST SURGERIES      Family History  Problem Relation Age of Onset  . Hypertension Mother   . Diabetes Father     Social History  Substance Use Topics  . Smoking status: Former Games developer  . Smokeless tobacco: Never Used  . Alcohol use No    Allergies: No Known Allergies  Prescriptions Prior to Admission  Medication Sig Dispense Refill Last Dose  . acetaminophen (TYLENOL) 500 MG tablet Take 1,000 mg by mouth every 6 (six) hours as needed for mild pain or headache.   06/18/2017 at Unknown time  . Prenatal Vit-Fe Fumarate-FA (PRENATAL MULTIVITAMIN) TABS tablet Take 1 tablet by mouth daily at 12 noon.   Past Week at Unknown time    Review of Systems  Constitutional: Positive for chills and fatigue.  HENT: Negative.   Eyes: Negative.   Respiratory: Negative.   Cardiovascular: Negative.   Gastrointestinal: Negative.   Endocrine: Negative.   Genitourinary: Negative for dysuria.  Musculoskeletal: Positive for back pain (RT flank pain since midnight).  Skin: Negative.   Allergic/Immunologic: Negative.   Neurological: Negative.   Hematological: Negative.   Psychiatric/Behavioral: Negative.    Physical Exam   Blood pressure (!) 105/59, pulse (!) 123, temperature 99.8 F (37.7 C), temperature source Oral, resp. rate 18, last menstrual period 09/20/2016.  Patient  Vitals for the past 24 hrs:  BP Temp Temp src Pulse Resp  06/19/17 1344 107/61 98.6 F (37 C) Oral (!) 102 -  06/19/17 1256 - - - (!) 123 -  06/19/17 1139 - - - (!) 119 -  06/19/17 1129 (!) 105/59 99.8 F (37.7 C) Oral (!) 154 18    Physical Exam  Constitutional: She is oriented to person, place, and time. She appears well-developed and well-nourished.  HENT:  Head: Normocephalic.  Eyes: Pupils are equal, round, and reactive to light.  Neck: Normal range of motion.  Cardiovascular: Normal rate, regular rhythm and normal heart sounds.   Respiratory: Effort normal and breath sounds normal.  GI: Soft. Normal appearance and bowel sounds are normal. There is CVA tenderness (RT side; mild).  Genitourinary:  Genitourinary Comments: Pelvic deferred  Musculoskeletal: Normal range of motion.  Neurological: She is alert and oriented to person, place, and time. She has normal reflexes.  Skin: Skin is warm and dry.  Psychiatric: She has a normal mood and affect. Her behavior is normal. Judgment and thought content normal.    MAU Course  Procedures  MDM CCUA NST - FHR: 150 bpm / moderate variability / accels present / decels absent TOCO: irregular  CBC w/Diff UCx LR 1000 ml bolus IVFs changed to NS for Rocephin 1 gram IVP  *Consult with Dr. Mindi Slicker @ 1409 - notified of patient's complaints, assessments, lab & U/S  results, recommended tx plan: Rocephin 1 gram now, Rx Macrobid 100 mg po BID x 7 days, F/U at least 1 wk after completion of meds, if appt is >2 wks, ok to d/c home.  Results for orders placed or performed during the hospital encounter of 06/19/17 (from the past 24 hour(s))  Urinalysis, Routine w reflex microscopic     Status: Abnormal   Collection Time: 06/19/17 11:15 AM  Result Value Ref Range   Color, Urine YELLOW YELLOW   APPearance HAZY (A) CLEAR   Specific Gravity, Urine 1.011 1.005 - 1.030   pH 7.0 5.0 - 8.0   Glucose, UA NEGATIVE NEGATIVE mg/dL   Hgb urine dipstick  SMALL (A) NEGATIVE   Bilirubin Urine NEGATIVE NEGATIVE   Ketones, ur 20 (A) NEGATIVE mg/dL   Protein, ur NEGATIVE NEGATIVE mg/dL   Nitrite NEGATIVE NEGATIVE   Leukocytes, UA LARGE (A) NEGATIVE   RBC / HPF 6-30 0 - 5 RBC/hpf   WBC, UA 6-30 0 - 5 WBC/hpf   Bacteria, UA RARE (A) NONE SEEN   Squamous Epithelial / LPF 6-30 (A) NONE SEEN   WBC Clumps PRESENT    Mucus PRESENT   CBC with Differential/Platelet     Status: Abnormal   Collection Time: 06/19/17 11:52 AM  Result Value Ref Range   WBC 15.2 (H) 4.0 - 10.5 K/uL   RBC 3.71 (L) 3.87 - 5.11 MIL/uL   Hemoglobin 10.5 (L) 12.0 - 15.0 g/dL   HCT 40.9 (L) 81.1 - 91.4 %   MCV 83.3 78.0 - 100.0 fL   MCH 28.3 26.0 - 34.0 pg   MCHC 34.0 30.0 - 36.0 g/dL   RDW 78.2 95.6 - 21.3 %   Platelets 235 150 - 400 K/uL   Neutrophils Relative % 82 %   Neutro Abs 12.5 (H) 1.7 - 7.7 K/uL   Lymphocytes Relative 15 %   Lymphs Abs 2.2 0.7 - 4.0 K/uL   Monocytes Relative 3 %   Monocytes Absolute 0.5 0.1 - 1.0 K/uL   Eosinophils Relative 0 %   Eosinophils Absolute 0.0 0.0 - 0.7 K/uL   Basophils Relative 0 %   Basophils Absolute 0.0 0.0 - 0.1 K/uL     Assessment and Plan  Abnormal urine odor - Suspected UTI  - Rocephin 1 gram IVP done here - Rx Macrobid 100 mg PO BID x 7 days - Keep scheduled appt with [facility] on 06/27/2017 - Return to MAU for worsening sx's or emergencies  Discharge home Patient verbalized an understanding of the plan of care and agrees.   Raelyn Mora, MSN, CNM 06/19/2017, 12:08 PM

## 2017-06-19 NOTE — MAU Note (Signed)
Pt C/O chills around 0500 this morning, lasted 1-2 hours, did not take temp.  R flank pain since midnight, feels weak, urine has odor.  Denies dysuria.  Had contractions this morning when was having chills, are now occasional.  Denies bleeding or LOF.

## 2017-06-20 ENCOUNTER — Inpatient Hospital Stay (HOSPITAL_COMMUNITY)
Admission: AD | Admit: 2017-06-20 | Discharge: 2017-06-20 | Disposition: A | Discharge status: Home / Self Care | Payer: 59 | Source: Ambulatory Visit | Attending: Obstetrics and Gynecology | Admitting: Obstetrics and Gynecology

## 2017-06-20 ENCOUNTER — Encounter (HOSPITAL_COMMUNITY): Payer: Self-pay

## 2017-06-20 DIAGNOSIS — B373 Candidiasis of vulva and vagina: Secondary | ICD-10-CM | POA: Insufficient documentation

## 2017-06-20 DIAGNOSIS — R6883 Chills (without fever): Secondary | ICD-10-CM | POA: Diagnosis present

## 2017-06-20 DIAGNOSIS — B3731 Acute candidiasis of vulva and vagina: Secondary | ICD-10-CM

## 2017-06-20 DIAGNOSIS — Z3A33 33 weeks gestation of pregnancy: Secondary | ICD-10-CM | POA: Diagnosis not present

## 2017-06-20 DIAGNOSIS — Z3689 Encounter for other specified antenatal screening: Secondary | ICD-10-CM | POA: Diagnosis not present

## 2017-06-20 DIAGNOSIS — O98813 Other maternal infectious and parasitic diseases complicating pregnancy, third trimester: Secondary | ICD-10-CM | POA: Insufficient documentation

## 2017-06-20 DIAGNOSIS — M549 Dorsalgia, unspecified: Secondary | ICD-10-CM | POA: Diagnosis present

## 2017-06-20 DIAGNOSIS — O2343 Unspecified infection of urinary tract in pregnancy, third trimester: Secondary | ICD-10-CM | POA: Insufficient documentation

## 2017-06-20 DIAGNOSIS — Z87891 Personal history of nicotine dependence: Secondary | ICD-10-CM | POA: Diagnosis not present

## 2017-06-20 LAB — CBC WITH DIFFERENTIAL/PLATELET
BASOS ABS: 0 10*3/uL (ref 0.0–0.1)
BASOS PCT: 0 %
EOS ABS: 0 10*3/uL (ref 0.0–0.7)
Eosinophils Relative: 0 %
HEMATOCRIT: 30.5 % — AB (ref 36.0–46.0)
HEMOGLOBIN: 10.3 g/dL — AB (ref 12.0–15.0)
Lymphocytes Relative: 21 %
Lymphs Abs: 3 10*3/uL (ref 0.7–4.0)
MCH: 28.1 pg (ref 26.0–34.0)
MCHC: 33.8 g/dL (ref 30.0–36.0)
MCV: 83.3 fL (ref 78.0–100.0)
MONOS PCT: 5 %
Monocytes Absolute: 0.8 10*3/uL (ref 0.1–1.0)
NEUTROS ABS: 11 10*3/uL — AB (ref 1.7–7.7)
NEUTROS PCT: 74 %
Platelets: 212 10*3/uL (ref 150–400)
RBC: 3.66 MIL/uL — AB (ref 3.87–5.11)
RDW: 13.8 % (ref 11.5–15.5)
WBC: 14.8 10*3/uL — AB (ref 4.0–10.5)

## 2017-06-20 LAB — WET PREP, GENITAL
CLUE CELLS WET PREP: NONE SEEN
SPERM: NONE SEEN
TRICH WET PREP: NONE SEEN

## 2017-06-20 LAB — URINALYSIS, ROUTINE W REFLEX MICROSCOPIC
BILIRUBIN URINE: NEGATIVE
Glucose, UA: NEGATIVE mg/dL
Hgb urine dipstick: NEGATIVE
KETONES UR: 80 mg/dL — AB
Nitrite: NEGATIVE
PH: 6 (ref 5.0–8.0)
PROTEIN: 30 mg/dL — AB
Specific Gravity, Urine: 1.027 (ref 1.005–1.030)

## 2017-06-20 LAB — AMNISURE RUPTURE OF MEMBRANE (ROM) NOT AT ARMC: AMNISURE: NEGATIVE

## 2017-06-20 MED ORDER — CEPHALEXIN 500 MG PO CAPS: 500.0000 mg | ORAL_CAPSULE | Freq: Three times a day (TID) | ORAL | 0 refills | Status: AC

## 2017-06-20 MED ORDER — TERCONAZOLE 0.4 % VA CREA: 1.0000 | TOPICAL_CREAM | Freq: Every day | VAGINAL | 0 refills | Status: DC

## 2017-06-20 NOTE — MAU Note (Signed)
Pt presents to MAU with chills and lower back pain. Pt denies fever and vaginal discharge/bleeding. +FM

## 2017-06-20 NOTE — MAU Provider Note (Signed)
History     CSN: [id number]  Arrival date and time: 06/20/17 2052   First Provider Initiated Contact with Patient 06/20/17 2150      Chief Complaint  Patient presents with  . Chills  . Back Pain   [id number] @33 .4 wks here with chills. Sx have been ongoing since yesterday. Has felt hot but hasn't checked temp. Was seen in MAU yesterday for right flank pain and was diagnosed with UTI. Has taken 2 doses of Macrobid. Using Tylenol for flank pain and helps. Denies urinary sx. Reports good FM. Reports leaking of clear fluid since Sunday. Soaking into underwear. No gush of fluid. No VB. No ctx.    OB History    Gravida Para Term Preterm AB Living   6 3 2 1 2 3    SAB TAB Ectopic Multiple Live Births   1 1 0 0 3      Past Medical History:  Diagnosis Date  . Medical history non-contributory   . Preterm delivery     Past Surgical History:  Procedure Laterality Date  . NO PAST SURGERIES      Family History  Problem Relation Age of Onset  . Hypertension Mother   . Diabetes Father     Social History  Substance Use Topics  . Smoking status: Former Games developer  . Smokeless tobacco: Never Used  . Alcohol use No    Allergies: No Known Allergies  Prescriptions Prior to Admission  Medication Sig Dispense Refill Last Dose  . acetaminophen (TYLENOL) 500 MG tablet Take 1,000 mg by mouth every 6 (six) hours as needed for mild pain or headache.   06/20/2017 at Unknown time  . nitrofurantoin, macrocrystal-monohydrate, (MACROBID) 100 MG capsule Take 1 capsule (100 mg total) by mouth 2 (two) times daily. 14 capsule 0 06/20/2017 at Unknown time  . Prenatal Vit-Fe Fumarate-FA (PRENATAL MULTIVITAMIN) TABS tablet Take 1 tablet by mouth daily at 12 noon.   Past Week at Unknown time    Review of Systems  Constitutional: Positive for chills. Negative for fever.  Gastrointestinal: Negative for abdominal pain.  Genitourinary: Positive for vaginal discharge. Negative for dysuria, frequency, hematuria  and urgency.  Musculoskeletal: Negative for back pain.   Physical Exam   Blood pressure 108/68, pulse (!) 112, temperature 98.2 F (36.8 C), temperature source Oral, resp. rate 18, last menstrual period 09/20/2016, unknown if currently breastfeeding.  Physical Exam  Nursing note and vitals reviewed. Constitutional: She is oriented to person, place, and time. She appears well-developed and well-nourished. No distress.  HENT:  Head: Normocephalic and atraumatic.  Neck: Normal range of motion.  Respiratory: Effort normal. No respiratory distress.  GI: Soft. She exhibits no distension. There is no tenderness. There is no guarding and no CVA tenderness.  gravid  Genitourinary:  Genitourinary Comments: External: no lesions or erythema Vagina: rugated, pink, moist, thick curdy yellow adherent discharge, no pool, no fern    Musculoskeletal: Normal range of motion.       Cervical back: Normal. She exhibits no tenderness.       Thoracic back: Normal. She exhibits no tenderness.       Lumbar back: Normal. She exhibits no bony tenderness.  Neurological: She is alert and oriented to person, place, and time.  Skin: Skin is warm and dry.  Psychiatric: She has a normal mood and affect.  EFM: 135 bpm, mod variability, + accels, late decel x1 Toco: rare, irritability  Results for orders placed or performed during the hospital encounter of 06/20/17 (  from the past 24 hour(s))  Urinalysis, Routine w reflex microscopic     Status: Abnormal   Collection Time: 06/20/17  9:05 PM  Result Value Ref Range   Color, Urine AMBER (A) YELLOW   APPearance HAZY (A) CLEAR   Specific Gravity, Urine 1.027 1.005 - 1.030   pH 6.0 5.0 - 8.0   Glucose, UA NEGATIVE NEGATIVE mg/dL   Hgb urine dipstick NEGATIVE NEGATIVE   Bilirubin Urine NEGATIVE NEGATIVE   Ketones, ur 80 (A) NEGATIVE mg/dL   Protein, ur 30 (A) NEGATIVE mg/dL   Nitrite NEGATIVE NEGATIVE   Leukocytes, UA TRACE (A) NEGATIVE   RBC / HPF 0-5 0 - 5  RBC/hpf   WBC, UA 6-30 0 - 5 WBC/hpf   Bacteria, UA FEW (A) NONE SEEN   Squamous Epithelial / LPF 6-30 (A) NONE SEEN   Mucus PRESENT   Wet prep, genital     Status: Abnormal   Collection Time: 06/20/17 10:05 PM  Result Value Ref Range   Yeast Wet Prep HPF POC PRESENT (A) NONE SEEN   Trich, Wet Prep NONE SEEN NONE SEEN   Clue Cells Wet Prep HPF POC NONE SEEN NONE SEEN   WBC, Wet Prep HPF POC MANY (A) NONE SEEN   Sperm NONE SEEN   Amnisure rupture of membrane (rom)not at [facility]     Status: None   Collection Time: 06/20/17 10:05 PM  Result Value Ref Range   Amnisure ROM NEGATIVE   CBC with Differential/Platelet     Status: Abnormal (Preliminary result)   Collection Time: 06/20/17 10:14 PM  Result Value Ref Range   WBC 14.8 (H) 4.0 - 10.5 K/uL   RBC 3.66 (L) 3.87 - 5.11 MIL/uL   Hemoglobin 10.3 (L) 12.0 - 15.0 g/dL   HCT 60.1 (L) 09.3 - 23.5 %   MCV 83.3 78.0 - 100.0 fL   MCH 28.1 26.0 - 34.0 pg   MCHC 33.8 30.0 - 36.0 g/dL   RDW 57.3 22.0 - 25.4 %   Platelets 212 150 - 400 K/uL   Neutrophils Relative % 74 %   Neutro Abs 11.0 (H) 1.7 - 7.7 K/uL   Lymphocytes Relative 21 %   Lymphs Abs 3.0 0.7 - 4.0 K/uL   Monocytes Relative 5 %   Monocytes Absolute 0.8 0.1 - 1.0 K/uL   Eosinophils Relative 0 %   Eosinophils Absolute 0.0 0.0 - 0.7 K/uL   Basophils Relative 0 %   Basophils Absolute 0.0 0.0 - 0.1 K/uL   Other PENDING %   MAU Course  Procedures  MDM Labs ordered and reviewed. No evidence of worsening UTI or pyelo. No evidence of PROM. Will treat yeast. Change abx to Keflex. Presentation, clinical findings, and plan discussed with Dr. Ellyn Hack. Stable for discharge home.  Assessment and Plan   1. [redacted] weeks gestation of pregnancy   2. Urinary tract infection in mother during third trimester of pregnancy   3. Yeast vaginitis   4. NST (non-stress test) reactive    Discharge home Follow up in OB office as scheduled Rx Keflex Rx Terazol Increase hydration Rest Tylenol  prn Return for worsening sx  Allergies as of 06/20/2017   No Known Allergies     Medication List    STOP taking these medications   nitrofurantoin (macrocrystal-monohydrate) 100 MG capsule Commonly known as:  MACROBID     TAKE these medications   acetaminophen 500 MG tablet Commonly known as:  TYLENOL Take 1,000 mg by mouth every  6 (six) hours as needed for mild pain or headache.   cephALEXin 500 MG capsule Commonly known as:  KEFLEX Take 1 capsule (500 mg total) by mouth 3 (three) times daily.   prenatal multivitamin Tabs tablet Take 1 tablet by mouth daily at 12 noon.   terconazole 0.4 % vaginal cream Commonly known as:  TERAZOL 7 Place 1 applicator vaginally at bedtime.      Donette Larry, CNM 06/20/2017, 9:57 PM

## 2017-06-20 NOTE — Discharge Instructions (Signed)
Vaginal Yeast infection, Adult °Vaginal yeast infection is a condition that causes soreness, swelling, and redness (inflammation) of the vagina. It also causes vaginal discharge. This is a common condition. Some women get this infection frequently. °What are the causes? °This condition is caused by a change in the normal balance of the yeast (candida) and bacteria that live in the vagina. This change causes an overgrowth of yeast, which causes the inflammation. °What increases the risk? °This condition is more likely to develop in: °· Women who take antibiotic medicines. °· Women who have diabetes. °· Women who take birth control pills. °· Women who are pregnant. °· Women who douche often. °· Women who have a weak defense (immune) system. °· Women who have been taking steroid medicines for a long time. °· Women who frequently wear tight clothing. ° °What are the signs or symptoms? °Symptoms of this condition include: °· White, thick vaginal discharge. °· Swelling, itching, redness, and irritation of the vagina. The lips of the vagina (vulva) may be affected as well. °· Pain or a burning feeling while urinating. °· Pain during sex. ° °How is this diagnosed? °This condition is diagnosed with a medical history and physical exam. This will include a pelvic exam. Your health care provider will examine a sample of your vaginal discharge under a microscope. Your health care provider may send this sample for testing to confirm the diagnosis. °How is this treated? °This condition is treated with medicine. Medicines may be over-the-counter or prescription. You may be told to use one or more of the following: °· Medicine that is taken orally. °· Medicine that is applied as a cream. °· Medicine that is inserted directly into the vagina (suppository). ° °Follow these instructions at home: °· Take or apply over-the-counter and prescription medicines only as told by your health care provider. °· Do not have sex until your health  care provider has approved. Tell your sex partner that you have a yeast infection. That person should go to his or her health care provider if he or she develops symptoms. °· Do not wear tight clothes, such as pantyhose or tight pants. °· Avoid using tampons until your health care provider approves. °· Eat more yogurt. This may help to keep your yeast infection from returning. °· Try taking a sitz bath to help with discomfort. This is a warm water bath that is taken while you are sitting down. The water should only come up to your hips and should cover your buttocks. Do this 3-4 times per day or as told by your health care provider. °· Do not douche. °· Wear breathable, cotton underwear. °· If you have diabetes, keep your blood sugar levels under control. °Contact a health care provider if: °· You have a fever. °· Your symptoms go away and then return. °· Your symptoms do not get better with treatment. °· Your symptoms get worse. °· You have new symptoms. °· You develop blisters in or around your vagina. °· You have blood coming from your vagina and it is not your menstrual period. °· You develop pain in your abdomen. °This information is not intended to replace advice given to you by your health care provider. Make sure you discuss any questions you have with your health care provider. °Document Released: 07/27/2005 Document Revised: 03/30/2016 Document Reviewed: 04/20/2015 °Elsevier Interactive Patient Education © 2018 [employer] °Pregnancy and Urinary Tract Infection °What is a urinary tract infection? °A urinary tract infection (UTI) is an infection of any   part of the urinary tract. This includes the kidneys, the tubes that connect your kidneys to your bladder (ureters), the bladder, and the tube that carries urine out of your body (urethra). These organs make, store, and get rid of urine in the body. A UTI can be a bladder infection (cystitis) or a kidney infection (pyelonephritis). This infection may be  caused by fungi, viruses, and bacteria. Bacteria are the most common cause of UTIs. °You are more likely to develop a UTI during pregnancy because: °· The physical and hormonal changes your body goes through can make it easier for bacteria to get into your urinary tract. °· Your growing baby puts pressure on your uterus and can affect urine flow. ° °Does a UTI place my baby at risk? °An untreated UTI during pregnancy could lead to a kidney infection, which can cause health problems that could affect your baby. Possible complications of an untreated UTI include: °· Having your baby before 37 weeks of pregnancy (premature). °· Having a baby with a low birth weight. °· Developing high blood pressure during pregnancy (preeclampsia). ° °What are the symptoms of a UTI? °Symptoms of a UTI include: °· Fever. °· Frequent urination or passing small amounts of urine frequently. °· Needing to urinate urgently. °· Pain or a burning sensation with urination. °· Urine that smells bad or unusual. °· Cloudy urine. °· Pain in the lower abdomen or back. °· Trouble urinating. °· Blood in the urine. °· Vomiting or being less hungry than normal. °· Diarrhea or abdominal pain. °· Vaginal discharge. ° °What are the treatment options for a UTI during pregnancy? °Treatment for this condition may include: °· Antibiotic medicines that are safe to take during pregnancy. °· Other medicines to treat less common causes of UTI. ° °How can I prevent a UTI? ° °To prevent a UTI: °· Go to the bathroom as soon as you feel the need. °· Always wipe from front to back. °· Wash your genital area with soap and warm water daily. °· Empty your bladder before and after sex. °· Wear cotton underwear. °· Limit your intake of high sugar foods or drinks, such as regular soda, juice, and sweets. °· Drink 6-8 glasses of water daily. °· Do not wear tight-fitting pants. °· Do not douche or use deodorant sprays. °· Do not drink alcohol, caffeine, or carbonated drinks.  These can irritate the bladder. ° °Contact a health care provider if: °· Your symptoms do not improve or get worse. °· You have a fever after two days of treatment. °· You have a rash. °· You have abnormal vaginal discharge. °· You have back or side pain. °· You have chills. °· You have nausea and vomiting. °Get help right away if: °Seek immediate medical care if you are pregnant and: °· You feel contractions in your uterus. °· You have lower belly pain. °· You have a gush of fluid from your vagina. °· You have blood in your urine. °· You are vomiting and cannot keep down any medicines or water. ° °This information is not intended to replace advice given to you by your health care provider. Make sure you discuss any questions you have with your health care provider. °Document Released: 02/11/2011 Document Revised: 09/30/2016 Document Reviewed: 09/07/2015 °Elsevier Interactive Patient Education © 2017 [employer] ° °

## 2017-06-21 LAB — GC/CHLAMYDIA PROBE AMP (~~LOC~~) NOT AT ARMC
Chlamydia: NEGATIVE
NEISSERIA GONORRHEA: NEGATIVE

## 2017-06-22 LAB — CULTURE, OB URINE

## 2017-07-07 LAB — OB RESULTS CONSOLE GBS: STREP GROUP B AG: NEGATIVE

## 2017-07-24 ENCOUNTER — Telehealth (HOSPITAL_COMMUNITY): Payer: Self-pay | Admitting: *Deleted

## 2017-07-24 ENCOUNTER — Encounter (HOSPITAL_COMMUNITY): Payer: Self-pay | Admitting: *Deleted

## 2017-07-24 NOTE — Telephone Encounter (Signed)
Preadmission screen  

## 2017-07-28 ENCOUNTER — Inpatient Hospital Stay (EMERGENCY_DEPARTMENT_HOSPITAL)
Admission: AD | Admit: 2017-07-28 | Discharge: 2017-07-28 | Disposition: A | Discharge status: Home / Self Care | Payer: 59 | Source: Ambulatory Visit | Attending: Obstetrics and Gynecology | Admitting: Obstetrics and Gynecology

## 2017-07-28 ENCOUNTER — Inpatient Hospital Stay (HOSPITAL_COMMUNITY): Payer: 59

## 2017-07-28 DIAGNOSIS — O4693 Antepartum hemorrhage, unspecified, third trimester: Secondary | ICD-10-CM | POA: Diagnosis not present

## 2017-07-28 DIAGNOSIS — B3731 Acute candidiasis of vulva and vagina: Secondary | ICD-10-CM

## 2017-07-28 DIAGNOSIS — O479 False labor, unspecified: Secondary | ICD-10-CM

## 2017-07-28 DIAGNOSIS — O26893 Other specified pregnancy related conditions, third trimester: Secondary | ICD-10-CM | POA: Diagnosis not present

## 2017-07-28 DIAGNOSIS — O4690 Antepartum hemorrhage, unspecified, unspecified trimester: Secondary | ICD-10-CM

## 2017-07-28 DIAGNOSIS — O98819 Other maternal infectious and parasitic diseases complicating pregnancy, unspecified trimester: Principal | ICD-10-CM

## 2017-07-28 DIAGNOSIS — B373 Candidiasis of vulva and vagina: Secondary | ICD-10-CM

## 2017-07-28 DIAGNOSIS — O288 Other abnormal findings on antenatal screening of mother: Secondary | ICD-10-CM

## 2017-07-28 LAB — WET PREP, GENITAL
CLUE CELLS WET PREP: NONE SEEN
Sperm: NONE SEEN
Trich, Wet Prep: NONE SEEN

## 2017-07-28 MED ORDER — MICONAZOLE NITRATE 2 % VA CREA: 1.0000 | TOPICAL_CREAM | Freq: Every day | VAGINAL | 0 refills | Status: DC

## 2017-07-28 NOTE — MAU Note (Addendum)
Woke up contracting.  Had a gush of blood when she pushed with BM.  Has only had 4 contractions since then. Has not continued to bleed. Was 2 cm when last checked.  Baby active

## 2017-07-28 NOTE — Discharge Instructions (Signed)
Vaginal Bleeding During Pregnancy, Third Trimester °A small amount of bleeding (spotting) from the vagina is common in pregnancy. Sometimes the bleeding is normal and is not a problem, and sometimes it is a sign of something serious. Be sure to tell your doctor about any bleeding from your vagina right away. °Follow these instructions at home: °· Watch your condition for any changes. °· Follow your doctor's instructions about how active you can be. °· If you are on bed rest: °? You may need to stay in bed and only get up to use the bathroom. °? You may be allowed to do some activities. °? If you need help, make plans for someone to help you. °· Write down: °? The number of pads you use each day. °? How often you change pads. °? How soaked (saturated) your pads are. °· Do not use tampons. °· Do not douche. °· Do not have sex or orgasms until your doctor says it is okay. °· Follow your doctor's advice about lifting, driving, and doing physical activities. °· If you pass any tissue from your vagina, save the tissue so you can show it to your doctor. °· Only take medicines as told by your doctor. °· Do not take aspirin because it can make you bleed. °· Keep all follow-up visits as told by your doctor. °Contact a doctor if: °· You bleed from your vagina. °· You have cramps. °· You have labor pains. °· You have a fever that does not go away after you take medicine. °Get help right away if: °· You have very bad cramps in your back or belly (abdomen). °· You have chills. °· You have a gush of fluid from your vagina. °· You pass large clots or tissue from your vagina. °· You bleed more. °· You feel light-headed or weak. °· You pass out (faint). °· You do not feel your baby move around as much as before. °This information is not intended to replace advice given to you by your health care provider. Make sure you discuss any questions you have with your health care provider. °Document Released: 03/03/2014 Document Revised:  03/24/2016 Document Reviewed: 06/24/2013 °Elsevier Interactive Patient Education © 2018 [employer] ° °

## 2017-07-28 NOTE — MAU Provider Note (Signed)
Chief Complaint:  Vaginal Bleeding and Contractions   None     HPI: Hannah Burch is a 31 y.o. [phone] at [redacted]w[redacted]d who presents to MAU reporting contractions since she woke up this morning. Having discomfort with the cramping in abdomen. States that when she went to the bathroom she had a gush of blood like a period. She wiped and had blood on the toilet tissue. However, since she has not noticed anymore blood. No blood on underwear. Had some cramping after as well. Denies leakage of fluid, vaginal discharge. Good fetal movement.   Pregnancy Course:  Receives prenatal care at [facility]   Past Medical History: Past Medical History:  Diagnosis Date  . Medical history non-contributory   . Preterm delivery     Past obstetric history: OB History  Gravida Para Term Preterm AB Living  SAB TAB Ectopic Multiple Live Births  1 1 0 0 3    # Outcome Date GA Lbr Len/2nd Weight Sex Delivery Anes PTL Lv  6 Current           5 Term 07/02/15 [redacted]w[redacted]d 15:20 / 00:13 3.305 kg (7 lb 4.6 oz) M Vag-Spont EPI  LIV     Birth Comments: caput  4 TAB 2015          3 Term 12/05/12    M Vag-Spont   LIV  2 Preterm 08/15/08    F Vag-Spont   LIV     Birth Comments: System Generated. Please review and update pregnancy details.  1 SAB               Past Surgical History: Past Surgical History:  Procedure Laterality Date  . NO PAST SURGERIES       Family History: Family History  Problem Relation Age of Onset  . Hypertension Mother   . Diabetes Father     Social History: Social History  Substance Use Topics  . Smoking status: Former Games developer  . Smokeless tobacco: Never Used  . Alcohol use No    Allergies: No Known Allergies  Meds:  Prescriptions Prior to Admission  Medication Sig Dispense Refill Last Dose  . Prenatal Vit-Fe Fumarate-FA (PRENATAL MULTIVITAMIN) TABS tablet Take 1 tablet by mouth daily at 12 noon.   Past Month at Unknown time  . acetaminophen (TYLENOL) 500 MG  tablet Take 1,000 mg by mouth every 6 (six) hours as needed for mild pain or headache.   prn  . terconazole (TERAZOL 7) 0.4 % vaginal cream Place 1 applicator vaginally at bedtime. (Patient not taking: Reported on 07/28/2017) 45 g 0 Completed Course at Unknown time    I have reviewed patient's Past Medical Hx, Surgical Hx, Family Hx, Social Hx, medications and allergies.   ROS:  All systems reviewed and are negative for acute change except as noted in the HPI.   Physical Exam  Patient Vitals for the past 24 hrs:  BP Temp Temp src Pulse Resp SpO2 Weight  07/28/17 1335 111/67 - - 85 16 - -  07/28/17 1315 119/80 98.6 F (37 C) Oral 76 18 100 % 62.5 kg (137 lb 12 oz)   Constitutional: Well-developed, well-nourished female in no acute distress.  Cardiovascular: normal rate and rhythm Respiratory: normal effort GI: Abd soft, non-tender, gravid appropriate for gestational age.  MS: Extremities nontender, no edema, normal ROM Neurologic: Alert and oriented x 4.  SPECULUM: NEFG, thick clumpy white discharge, dark blood with small mm clots, cervix not  visulaized Pelvic: cervical exam 1.5/thick/ballotable. No CMT  Labs: Results for orders placed or performed during the hospital encounter of 07/28/17 (from the past 24 hour(s))  Wet prep, genital     Status: Abnormal   Collection Time: 07/28/17  1:57 PM  Result Value Ref Range   Yeast Wet Prep HPF POC PRESENT (A) NONE SEEN   Trich, Wet Prep NONE SEEN NONE SEEN   Clue Cells Wet Prep HPF POC NONE SEEN NONE SEEN   WBC, Wet Prep HPF POC MANY (A) NONE SEEN   Sperm NONE SEEN     Imaging:  [place] Mfm Fetal Bpp Wo Non Stress  Result Date: 07/28/2017 ----------------------------------------------------------------------  OBSTETRICS REPORT                      (Signed Final 07/28/2017 04:27 pm) ---------------------------------------------------------------------- Patient Info  ID #:       [id number]                         D.O.B.:   24-Jan-1986 (31 yrs)   Name:       Hannah Burch           Visit Date:  07/28/2017 03:25 pm ---------------------------------------------------------------------- Performed By  Performed By:     Fayne Norrie BS,      Ref. Address:     [address] [place]                                                             [place]                                                             Jacky Kindle                                                             [phone]  Attending:        Ledon Snare MD         Location:         [facility]  Referred By:      Dorathy Kinsman                    CNM ---------------------------------------------------------------------- Orders   #  Description                                 Code   1  [place] MFM OB LIMITED                           [phone]   2  [place] MFM FETAL BPP WO NON STRESS  16109.60  ----------------------------------------------------------------------   #  Ordered By               Order #        Accession #    Episode #   1  JAZMA PHELPS             [id number]      [id number]     [id number]   2  JAZMA PHELPS             [id number]      [id number]     [id number]  ---------------------------------------------------------------------- Indications   [redacted] weeks gestation of pregnancy                Z3A.39   Vaginal bleeding in pregnancy, third trimester O46.93  ---------------------------------------------------------------------- OB History  Gravidity:    6         Term:   2        Prem:   1        SAB:   1  TOP:          1        Living:  3 ---------------------------------------------------------------------- Fetal Evaluation  Num Of Fetuses:     1  Fetal Heart         135  Rate(bpm):  Cardiac Activity:   Observed  Presentation:       Cephalic  Placenta:           Fundal, above cervical os  P. Cord Insertion:  Not well visualized  Amniotic Fluid  AFI FV:      Subjectively within normal limits  AFI Sum(cm)     %Tile       Largest Pocket(cm)  9.87            27           3.79  RUQ(cm)       RLQ(cm)       LUQ(cm)        LLQ(cm)  2.39          3.79          1.81           1.88 ---------------------------------------------------------------------- Biophysical Evaluation  Amniotic F.V:   Pocket => 2 cm two         F. Tone:        Observed                  planes  F. Movement:    Observed                   Score:          8/8  F. Breathing:   Observed ---------------------------------------------------------------------- Gestational Age  Clinical EDD:  39w 0d                                        EDD:   08/04/17  Best:          39w 0d    Det. By:   Clinical EDD             EDD:   08/04/17 ---------------------------------------------------------------------- Cervix Uterus Adnexa  Cervix  Not visualized (advanced GA >29wks) ---------------------------------------------------------------------- Impression  Singleton intrauterine pregnancy at 39 weeks 0 days  gestation with fetal cardiac activity  Cecphalic presentation  Fundal placenta  BPP 8/8 with an AFI >  9 cm ---------------------------------------------------------------------- Recommendations  Follow-up ultrasounds as clinically indicated. ----------------------------------------------------------------------                   Ledon Snare, MD Electronically Signed Final Report   07/28/2017 04:27 pm ----------------------------------------------------------------------  [place] Mfm Ob Limited  Result Date: 07/28/2017 ----------------------------------------------------------------------  OBSTETRICS REPORT                      (Signed Final 07/28/2017 04:27 pm) ---------------------------------------------------------------------- Patient Info  ID #:       [id number]                         D.O.B.:   10-Aug-1986 (31 yrs)  Name:       Hannah Burch           Visit Date:  07/28/2017 03:25 pm ---------------------------------------------------------------------- Performed By  Performed By:     Fayne Norrie BS,      Ref. Address:      [address] [place]                                                             [place]                                                             Jacky Kindle                                                             [phone]  Attending:        Ledon Snare MD         Location:         [facility]  Referred By:      Dorathy Kinsman                    CNM ---------------------------------------------------------------------- Orders   #  Description                                 Code   1  [place] MFM OB LIMITED                           [phone]   2  [place] MFM FETAL BPP WO NON STRESS              76819.01  ----------------------------------------------------------------------   #  Ordered By               Order #        Accession #    Episode #   1  JAZMA PHELPS  [id number]      [id number]     [id number]   2  JAZMA PHELPS             [id number]      [id number]     [id number]  ---------------------------------------------------------------------- Indications   [redacted] weeks gestation of pregnancy                Z3A.39   Vaginal bleeding in pregnancy, third trimester O46.93  ---------------------------------------------------------------------- OB History  Gravidity:    6         Term:   2        Prem:   1        SAB:   1  TOP:          1        Living:  3 ---------------------------------------------------------------------- Fetal Evaluation  Num Of Fetuses:     1  Fetal Heart         135  Rate(bpm):  Cardiac Activity:   Observed  Presentation:       Cephalic  Placenta:           Fundal, above cervical os  P. Cord Insertion:  Not well visualized  Amniotic Fluid  AFI FV:      Subjectively within normal limits  AFI Sum(cm)     %Tile       Largest Pocket(cm)  9.87            27          3.79  RUQ(cm)       RLQ(cm)       LUQ(cm)        LLQ(cm)  2.39          3.79          1.81           1.88 ---------------------------------------------------------------------- Biophysical Evaluation  Amniotic F.V:    Pocket => 2 cm two         F. Tone:        Observed                  planes  F. Movement:    Observed                   Score:          8/8  F. Breathing:   Observed ---------------------------------------------------------------------- Gestational Age  Clinical EDD:  39w 0d                                        EDD:   08/04/17  Best:          39w 0d    Det. By:   Clinical EDD             EDD:   08/04/17 ---------------------------------------------------------------------- Cervix Uterus Adnexa  Cervix  Not visualized (advanced GA >29wks) ---------------------------------------------------------------------- Impression  Singleton intrauterine pregnancy at 39 weeks 0 days  gestation with fetal cardiac activity  Cecphalic presentation  Fundal placenta  BPP 8/8 with an AFI > 9 cm ---------------------------------------------------------------------- Recommendations  Follow-up ultrasounds as clinically indicated. ----------------------------------------------------------------------                   Ledon Snare, MD Electronically Signed Final Report   07/28/2017 04:27 pm ----------------------------------------------------------------------   MAU Course: Vitals and nursing notes reviewed I have ordered labs/imaging and  reviewed them Wet prep with yeast Speculum exam with significant vaginal bleeding  [place] ordered to examine bleeding; placenta is fundal no cause of bleeding identified FHT with some variable decels and non-reactive so BPP ordered BPP 8/8 Cervical exam unchanged after extended monitoring Discussed case with Dr. Ellyn Hack Treatments given in MAU:   I personally reviewed the patient's NST today, found to be NON-REACTIVE. 135 bpm, mod var, no accels, no decels. CTX: 5-8 min. Had periods of reactivity  MDM: Plan of care reviewed with patient, including labs and tests ordered and medical treatment.   Assessment: 1. Candidiasis of vagina during pregnancy   2. Vaginal bleeding in pregnancy,  third trimester   3. Non-reactive NST (non-stress test)   4. Braxton Hicks contractions   5. Vaginal bleeding during pregnancy     Plan: Discharge home in stable condition.  Rx given for antifungal vaginal suppository Labor precautions and fetal kick counts discussed Handout given Follow-up with OB provider   Caryl Ada, DO OB Fellow [facility] 07/28/2017 2:01 PM  On review, NST was REACTIVE

## 2017-07-29 ENCOUNTER — Inpatient Hospital Stay (HOSPITAL_COMMUNITY): Payer: 59 | Admitting: Anesthesiology

## 2017-07-29 ENCOUNTER — Inpatient Hospital Stay (HOSPITAL_COMMUNITY)
Admission: AD | Admit: 2017-07-29 | Discharge: 2017-08-01 | DRG: 798 | Disposition: A | Discharge status: Home / Self Care | Payer: 59 | Source: Ambulatory Visit | Attending: Obstetrics and Gynecology | Admitting: Obstetrics and Gynecology

## 2017-07-29 ENCOUNTER — Encounter (HOSPITAL_COMMUNITY): Payer: Self-pay | Admitting: *Deleted

## 2017-07-29 DIAGNOSIS — Z3490 Encounter for supervision of normal pregnancy, unspecified, unspecified trimester: Secondary | ICD-10-CM

## 2017-07-29 DIAGNOSIS — Z302 Encounter for sterilization: Secondary | ICD-10-CM

## 2017-07-29 DIAGNOSIS — Z23 Encounter for immunization: Secondary | ICD-10-CM

## 2017-07-29 DIAGNOSIS — Z87891 Personal history of nicotine dependence: Secondary | ICD-10-CM

## 2017-07-29 DIAGNOSIS — Z3483 Encounter for supervision of other normal pregnancy, third trimester: Secondary | ICD-10-CM

## 2017-07-29 DIAGNOSIS — R109 Unspecified abdominal pain: Secondary | ICD-10-CM

## 2017-07-29 DIAGNOSIS — Z3A39 39 weeks gestation of pregnancy: Secondary | ICD-10-CM

## 2017-07-29 DIAGNOSIS — O26893 Other specified pregnancy related conditions, third trimester: Secondary | ICD-10-CM | POA: Diagnosis present

## 2017-07-29 LAB — CBC
HCT: 32.4 % — ABNORMAL LOW (ref 36.0–46.0)
HEMOGLOBIN: 10.6 g/dL — AB (ref 12.0–15.0)
MCH: 26.9 pg (ref 26.0–34.0)
MCHC: 32.7 g/dL (ref 30.0–36.0)
MCV: 82.2 fL (ref 78.0–100.0)
Platelets: 291 10*3/uL (ref 150–400)
RBC: 3.94 MIL/uL (ref 3.87–5.11)
RDW: 14.8 % (ref 11.5–15.5)
WBC: 9.8 10*3/uL (ref 4.0–10.5)

## 2017-07-29 LAB — TYPE AND SCREEN
ABO/RH(D): A POS
ANTIBODY SCREEN: NEGATIVE

## 2017-07-29 MED ORDER — FLEET ENEMA 7-19 GM/118ML RE ENEM: 1.0000 | ENEMA | RECTAL | Status: DC | PRN

## 2017-07-29 MED ORDER — BUTORPHANOL TARTRATE 1 MG/ML IJ SOLN: 1.0000 mg | INTRAMUSCULAR | Status: DC | PRN

## 2017-07-29 MED ORDER — OXYCODONE-ACETAMINOPHEN 5-325 MG PO TABS: 2.0000 | ORAL_TABLET | ORAL | Status: DC | PRN

## 2017-07-29 MED ORDER — EPHEDRINE 5 MG/ML INJ
10.0000 mg | INTRAVENOUS | Status: DC | PRN
  Administered 2017-07-30: 10 mg via INTRAVENOUS

## 2017-07-29 MED ORDER — LACTATED RINGERS IV SOLN
500.0000 mL | INTRAVENOUS | Status: DC | PRN
  Administered 2017-07-30 (×2): 500 mL via INTRAVENOUS

## 2017-07-29 MED ORDER — OXYTOCIN BOLUS FROM INFUSION
500.0000 mL | Freq: Once | INTRAVENOUS | Status: AC
  Administered 2017-07-30: 500 mL via INTRAVENOUS

## 2017-07-29 MED ORDER — LACTATED RINGERS IV SOLN
INTRAVENOUS | Status: DC
  Administered 2017-07-29: 22:00:00 via INTRAVENOUS

## 2017-07-29 MED ORDER — PHENYLEPHRINE 40 MCG/ML (10ML) SYRINGE FOR IV PUSH (FOR BLOOD PRESSURE SUPPORT)
80.0000 ug | PREFILLED_SYRINGE | INTRAVENOUS | Status: DC | PRN
  Filled 2017-07-29: qty 10

## 2017-07-29 MED ORDER — LIDOCAINE HCL (PF) 1 % IJ SOLN
INTRAMUSCULAR | Status: DC | PRN
  Administered 2017-07-29 (×2): 5 mL

## 2017-07-29 MED ORDER — FENTANYL 2.5 MCG/ML BUPIVACAINE 1/10 % EPIDURAL INFUSION (WH - ANES)
14.0000 mL/h | INTRAMUSCULAR | Status: DC | PRN
  Administered 2017-07-29 – 2017-07-30 (×2): 14 mL/h via EPIDURAL
  Filled 2017-07-29 (×2): qty 100

## 2017-07-29 MED ORDER — LACTATED RINGERS IV SOLN
500.0000 mL | Freq: Once | INTRAVENOUS | Status: AC
  Administered 2017-07-29: 500 mL via INTRAVENOUS

## 2017-07-29 MED ORDER — ACETAMINOPHEN 325 MG PO TABS: 650.0000 mg | ORAL_TABLET | ORAL | Status: DC | PRN

## 2017-07-29 MED ORDER — DIPHENHYDRAMINE HCL 50 MG/ML IJ SOLN: 12.5000 mg | INTRAMUSCULAR | Status: DC | PRN

## 2017-07-29 MED ORDER — EPHEDRINE 5 MG/ML INJ
10.0000 mg | INTRAVENOUS | Status: DC | PRN
  Filled 2017-07-29: qty 4

## 2017-07-29 MED ORDER — LIDOCAINE HCL (PF) 1 % IJ SOLN: 30.0000 mL | INTRAMUSCULAR | Status: DC | PRN

## 2017-07-29 MED ORDER — OXYTOCIN 40 UNITS IN LACTATED RINGERS INFUSION - SIMPLE MED
2.5000 [IU]/h | INTRAVENOUS | Status: DC
  Administered 2017-07-30: 2.5 [IU]/h via INTRAVENOUS
  Filled 2017-07-29: qty 1000

## 2017-07-29 MED ORDER — PHENYLEPHRINE 40 MCG/ML (10ML) SYRINGE FOR IV PUSH (FOR BLOOD PRESSURE SUPPORT): 80.0000 ug | PREFILLED_SYRINGE | INTRAVENOUS | Status: DC | PRN

## 2017-07-29 MED ORDER — SOD CITRATE-CITRIC ACID 500-334 MG/5ML PO SOLN
30.0000 mL | ORAL | Status: DC | PRN
  Administered 2017-07-30: 30 mL via ORAL
  Filled 2017-07-29 (×2): qty 15

## 2017-07-29 MED ORDER — OXYCODONE-ACETAMINOPHEN 5-325 MG PO TABS: 1.0000 | ORAL_TABLET | ORAL | Status: DC | PRN

## 2017-07-29 MED ORDER — ONDANSETRON HCL 4 MG/2ML IJ SOLN
4.0000 mg | Freq: Four times a day (QID) | INTRAMUSCULAR | Status: DC | PRN
  Administered 2017-07-29: 4 mg via INTRAVENOUS
  Filled 2017-07-29: qty 2

## 2017-07-29 NOTE — H&P (Signed)
Hannah Burch is a 31 y.o. female [phone] at 39+ in labor with cervical change in MAU since seen yesterday 1.2 cm to 4 cm.  Has h/o PTD - IOL due to oligo.  Also desires BTL.  Some irregularity to [facility].  Received Tdap.  Pregnancy dated by early [place].  Had some bloody show yesterday, but no cervical change.    OB History    Gravida Para Term Preterm AB Living   SAB TAB Ectopic Multiple Live Births   1 1 0 0 3    30wk SVD, IOL d/t oligo 4# female 43wk SVD 6#, female 67wk SVD 6# female G5 current preg  No abn pap H/o Chl   Past Medical History:  Diagnosis Date  . Medical history non-contributory   . Normal pregnancy in multigravida in third trimester 07/29/2017  . Preterm delivery    Past Surgical History:  Procedure Laterality Date  . NO PAST SURGERIES     Family History: family history includes Diabetes in her father; Hypertension in her mother. Social History:  reports that she has quit smoking. She has never used smokeless tobacco. She reports that she does not drink alcohol or use drugs. Meds PNV All NKDA    Maternal Diabetes: No Genetic Screening: Normal Maternal Ultrasounds/Referrals: Normal Fetal Ultrasounds or other Referrals:  None Maternal Substance Abuse:  No Significant Maternal Medications:  None Significant Maternal Lab Results:  Lab values include: Group B Strep negative Other Comments:  None  Review of Systems  Constitutional: Negative.   HENT: Negative.   Eyes: Negative.   Respiratory: Negative.   Cardiovascular: Negative.   Gastrointestinal: Negative.   Genitourinary: Negative.   Musculoskeletal: Negative.   Skin: Negative.   Neurological: Negative.   Psychiatric/Behavioral: Negative.    Maternal Medical History:  Reason for admission: Contractions.   Contractions: Onset was yesterday.   Frequency: regular.   Perceived severity is moderate.    Fetal activity: Perceived fetal activity is normal.    Prenatal Complications -  Diabetes: none.    Dilation: 4 Effacement (%): 60 Station: -3 Exam by:: Lafonda Mosses Ansah-Mensah, rnc  Blood pressure 114/76, pulse 72, temperature 98.1 F (36.7 C), resp. rate 18, height 5' (1.524 m), weight 62.1 kg (137 lb), last menstrual period 09/20/2016, unknown if currently breastfeeding. Maternal Exam:  Uterine Assessment: Contraction strength is moderate.  Contraction frequency is regular.   Abdomen: Patient reports no abdominal tenderness. Fundal height is appropriate for gestation.   Estimated fetal weight is 6-7#.   Fetal presentation: vertex  Introitus: Normal vulva. Normal vagina.  Cervix: Cervix evaluated by digital exam.     Physical Exam  Constitutional: She is oriented to person, place, and time. She appears well-developed and well-nourished.  HENT:  Head: Normocephalic and atraumatic.  Cardiovascular: Normal rate and regular rhythm.   Respiratory: Effort normal and breath sounds normal. No respiratory distress. She has no wheezes.  GI: Soft. Bowel sounds are normal. She exhibits no distension. There is no tenderness.  Musculoskeletal: Normal range of motion.  Neurological: She is alert and oriented to person, place, and time.  Skin: Skin is warm and dry.  Psychiatric: She has a normal mood and affect. Her behavior is normal.    Prenatal labs: ABO, Rh: A/Positive/-- (03/27 0000) Antibody: Negative (03/27 0000) Rubella: Immune (03/27 0000) RPR: Nonreactive (03/27 0000)  HBsAg: Negative (03/27 0000)  HIV: Non-reactive (03/27 0000)  GBS: Negative (09/07 0000)  Tdap 12/22/16 Hgb 11,6/Plt  259K/UrCx + Ecoli/GC neg/Chl neg/Pap WNL/Hgb electro WNL/AFP WNL/glucola 79  [place] nl anat, cwd, female Assessment/Plan: 40JW [id number] at 39+ in active labor gbbs neg - no prophylaxis Augment with pitocin pprn Also AROM Epidural PRN Expect SVD Desires PP BTL   Rosalea Withrow Bovard-Stuckert 07/29/2017, 8:48 PM

## 2017-07-29 NOTE — MAU Note (Signed)
Contractions, no leaking of fluid or bleeding

## 2017-07-29 NOTE — Progress Notes (Signed)
Patient ID: Hannah Burch, female   DOB: [birth year], 31 y.o.   MRN: [id number]   H&P reviewed, no changes.  D/w pt PP BTL - 8am - NPO after delivery.  Comfortable with epidural  AFVSS gen NAD FHTs 120-130, mod var, + accels, category 1-2 toco q 2-43min  SVE 6.7/70/-1-2  AROM for clear fluid,w/o diff/comp  Continue current mgmt Expect SVD

## 2017-07-29 NOTE — Anesthesia Preprocedure Evaluation (Signed)

## 2017-07-29 NOTE — MAU Note (Signed)
Was seen here yesterday for labor ck and sent home. Ctxs have been irreg off and on since last night. Closer and stronger tonight. Denies LOF or bleeding. 1.5cm yesterday

## 2017-07-29 NOTE — Anesthesia Procedure Notes (Signed)
Epidural Patient location during procedure: OB  Staffing Anesthesiologist: Abigail Marsiglia Performed: anesthesiologist   Preanesthetic Checklist Completed: patient identified, site marked, surgical consent, pre-op evaluation, timeout performed, IV checked, risks and benefits discussed and monitors and equipment checked  Epidural Patient position: sitting Prep: DuraPrep Patient monitoring: heart rate, continuous pulse ox and blood pressure Approach: right paramedian Location: L3-L4 Injection technique: LOR saline  Needle:  Needle type: Tuohy  Needle gauge: 17 G Needle length: 9 cm and 9 Needle insertion depth: 5 cm Catheter type: closed end flexible Catheter size: 20 Guage Catheter at skin depth: 9 cm Test dose: negative  Assessment Events: blood not aspirated, injection not painful, no injection resistance, negative IV test and no paresthesia  Additional Notes Patient identified. Risks/Benefits/Options discussed with patient including but not limited to bleeding, infection, nerve damage, paralysis, failed block, incomplete pain control, headache, blood pressure changes, nausea, vomiting, reactions to medication both or allergic, itching and postpartum back pain. Confirmed with bedside nurse the patient's most recent platelet count. Confirmed with patient that they are not currently taking any anticoagulation, have any bleeding history or any family history of bleeding disorders. Patient expressed understanding and wished to proceed. All questions were answered. Sterile technique was used throughout the entire procedure. Please see nursing notes for vital signs. Test dose was given through epidural needle and negative prior to continuing to dose epidural or start infusion. Warning signs of high block given to the patient including shortness of breath, tingling/numbness in hands, complete motor block, or any concerning symptoms with instructions to call for help. Patient was given  instructions on fall risk and not to get out of bed. All questions and concerns addressed with instructions to call with any issues.     

## 2017-07-30 ENCOUNTER — Encounter (HOSPITAL_COMMUNITY): Payer: Self-pay

## 2017-07-30 ENCOUNTER — Inpatient Hospital Stay (HOSPITAL_COMMUNITY): Payer: 59 | Admitting: Anesthesiology

## 2017-07-30 ENCOUNTER — Encounter (HOSPITAL_COMMUNITY)
Admission: AD | Disposition: A | Discharge status: Home / Self Care | Payer: Self-pay | Source: Ambulatory Visit | Attending: Obstetrics and Gynecology

## 2017-07-30 HISTORY — PX: TUBAL LIGATION: SHX77

## 2017-07-30 LAB — RPR: RPR: NONREACTIVE

## 2017-07-30 SURGERY — LIGATION, FALLOPIAN TUBE, POSTPARTUM
Anesthesia: Epidural

## 2017-07-30 SURGERY — LIGATION, FALLOPIAN TUBE, POSTPARTUM
Anesthesia: Epidural | Laterality: Bilateral | Wound class: Clean Contaminated

## 2017-07-30 MED ORDER — PROMETHAZINE HCL 25 MG/ML IJ SOLN: 6.2500 mg | INTRAMUSCULAR | Status: DC | PRN

## 2017-07-30 MED ORDER — ZOLPIDEM TARTRATE 5 MG PO TABS: 5.0000 mg | ORAL_TABLET | Freq: Every evening | ORAL | Status: DC | PRN

## 2017-07-30 MED ORDER — WITCH HAZEL-GLYCERIN EX PADS: 1.0000 "application " | MEDICATED_PAD | CUTANEOUS | Status: DC | PRN

## 2017-07-30 MED ORDER — OXYCODONE HCL 5 MG PO TABS
5.0000 mg | ORAL_TABLET | ORAL | Status: DC | PRN
  Administered 2017-07-30 (×2): 5 mg via ORAL
  Filled 2017-07-30 (×2): qty 1

## 2017-07-30 MED ORDER — ONDANSETRON HCL 4 MG/2ML IJ SOLN
INTRAMUSCULAR | Status: AC
  Filled 2017-07-30: qty 2

## 2017-07-30 MED ORDER — SODIUM BICARBONATE 8.4 % IV SOLN
INTRAVENOUS | Status: AC
  Filled 2017-07-30: qty 50

## 2017-07-30 MED ORDER — ONDANSETRON HCL 4 MG PO TABS
4.0000 mg | ORAL_TABLET | ORAL | Status: DC | PRN
  Administered 2017-07-30 (×2): 4 mg via ORAL
  Filled 2017-07-30 (×2): qty 1

## 2017-07-30 MED ORDER — ONDANSETRON HCL 4 MG/2ML IJ SOLN: 4.0000 mg | INTRAMUSCULAR | Status: DC | PRN

## 2017-07-30 MED ORDER — BENZOCAINE-MENTHOL 20-0.5 % EX AERO
1.0000 "application " | INHALATION_SPRAY | CUTANEOUS | Status: DC | PRN
  Administered 2017-07-30: 1 via TOPICAL
  Filled 2017-07-30: qty 56

## 2017-07-30 MED ORDER — OXYTOCIN 40 UNITS IN LACTATED RINGERS INFUSION - SIMPLE MED
1.0000 m[IU]/min | INTRAVENOUS | Status: DC
  Administered 2017-07-30: 2 m[IU]/min via INTRAVENOUS

## 2017-07-30 MED ORDER — OXYCODONE HCL 5 MG PO TABS
10.0000 mg | ORAL_TABLET | ORAL | Status: DC | PRN
  Administered 2017-07-30: 10 mg via ORAL
  Filled 2017-07-30: qty 2

## 2017-07-30 MED ORDER — TERBUTALINE SULFATE 1 MG/ML IJ SOLN: 0.2500 mg | Freq: Once | INTRAMUSCULAR | Status: DC | PRN

## 2017-07-30 MED ORDER — TERBUTALINE SULFATE 1 MG/ML IJ SOLN: 0.2500 mg | Freq: Once | INTRAMUSCULAR | Status: DC

## 2017-07-30 MED ORDER — BUPIVACAINE HCL (PF) 0.25 % IJ SOLN
INTRAMUSCULAR | Status: AC
  Filled 2017-07-30: qty 30

## 2017-07-30 MED ORDER — ACETAMINOPHEN 325 MG PO TABS
650.0000 mg | ORAL_TABLET | ORAL | Status: DC | PRN
  Administered 2017-07-30 – 2017-07-31 (×3): 650 mg via ORAL
  Filled 2017-07-30 (×3): qty 2

## 2017-07-30 MED ORDER — PRENATAL MULTIVITAMIN CH
1.0000 | ORAL_TABLET | Freq: Every day | ORAL | Status: DC
  Administered 2017-07-30: 1 via ORAL
  Filled 2017-07-30 (×2): qty 1

## 2017-07-30 MED ORDER — SIMETHICONE 80 MG PO CHEW
80.0000 mg | CHEWABLE_TABLET | ORAL | Status: DC | PRN
  Administered 2017-07-30 – 2017-07-31 (×5): 80 mg via ORAL
  Filled 2017-07-30 (×5): qty 1

## 2017-07-30 MED ORDER — COCONUT OIL OIL
1.0000 "application " | TOPICAL_OIL | Status: DC | PRN
  Administered 2017-07-31: 1 via TOPICAL
  Filled 2017-07-30: qty 120

## 2017-07-30 MED ORDER — MEPERIDINE HCL 25 MG/ML IJ SOLN: 6.2500 mg | INTRAMUSCULAR | Status: DC | PRN

## 2017-07-30 MED ORDER — FENTANYL CITRATE (PF) 100 MCG/2ML IJ SOLN
INTRAMUSCULAR | Status: AC
  Filled 2017-07-30: qty 2

## 2017-07-30 MED ORDER — MIDAZOLAM HCL 2 MG/2ML IJ SOLN: 0.5000 mg | Freq: Once | INTRAMUSCULAR | Status: DC | PRN

## 2017-07-30 MED ORDER — MIDAZOLAM HCL 2 MG/2ML IJ SOLN
INTRAMUSCULAR | Status: AC
  Filled 2017-07-30: qty 2

## 2017-07-30 MED ORDER — KETOROLAC TROMETHAMINE 30 MG/ML IJ SOLN
30.0000 mg | Freq: Once | INTRAMUSCULAR | Status: AC
  Administered 2017-07-30: 30 mg via INTRAVENOUS

## 2017-07-30 MED ORDER — SODIUM BICARBONATE 8.4 % IV SOLN
INTRAVENOUS | Status: DC | PRN
  Administered 2017-07-30 (×2): 5 mL via EPIDURAL

## 2017-07-30 MED ORDER — DIBUCAINE 1 % RE OINT: 1.0000 "application " | TOPICAL_OINTMENT | RECTAL | Status: DC | PRN

## 2017-07-30 MED ORDER — INFLUENZA VAC SPLIT QUAD 0.5 ML IM SUSY
0.5000 mL | PREFILLED_SYRINGE | INTRAMUSCULAR | Status: AC
  Administered 2017-07-31: 0.5 mL via INTRAMUSCULAR
  Filled 2017-07-30: qty 0.5

## 2017-07-30 MED ORDER — MORPHINE SULFATE (PF) 4 MG/ML IV SOLN: 1.0000 mg | INTRAVENOUS | Status: DC | PRN

## 2017-07-30 MED ORDER — IBUPROFEN 600 MG PO TABS
600.0000 mg | ORAL_TABLET | Freq: Four times a day (QID) | ORAL | Status: DC
  Administered 2017-07-30 – 2017-08-01 (×7): 600 mg via ORAL
  Filled 2017-07-30 (×7): qty 1

## 2017-07-30 MED ORDER — FENTANYL CITRATE (PF) 100 MCG/2ML IJ SOLN
INTRAMUSCULAR | Status: DC | PRN
  Administered 2017-07-30 (×2): 25 ug via INTRAVENOUS
  Administered 2017-07-30: 50 ug via INTRAVENOUS

## 2017-07-30 MED ORDER — SENNOSIDES-DOCUSATE SODIUM 8.6-50 MG PO TABS
2.0000 | ORAL_TABLET | ORAL | Status: DC
  Administered 2017-07-30 – 2017-08-01 (×2): 2 via ORAL
  Filled 2017-07-30 (×2): qty 2

## 2017-07-30 MED ORDER — LACTATED RINGERS IV SOLN: INTRAVENOUS | Status: DC

## 2017-07-30 MED ORDER — LACTATED RINGERS IV SOLN
INTRAVENOUS | Status: DC | PRN
  Administered 2017-07-30: 09:00:00 via INTRAVENOUS

## 2017-07-30 MED ORDER — DIPHENHYDRAMINE HCL 25 MG PO CAPS: 25.0000 mg | ORAL_CAPSULE | Freq: Four times a day (QID) | ORAL | Status: DC | PRN

## 2017-07-30 MED ORDER — MIDAZOLAM HCL 5 MG/5ML IJ SOLN
INTRAMUSCULAR | Status: DC | PRN
  Administered 2017-07-30 (×2): 1 mg via INTRAVENOUS

## 2017-07-30 MED ORDER — TERBUTALINE SULFATE 1 MG/ML IJ SOLN
INTRAMUSCULAR | Status: AC
Start: 2017-07-30 — End: 2017-07-30
  Administered 2017-07-30: 0.25 mg
  Filled 2017-07-30: qty 1

## 2017-07-30 MED ORDER — BUPIVACAINE-EPINEPHRINE (PF) 0.25% -1:200000 IJ SOLN
INTRAMUSCULAR | Status: AC
  Filled 2017-07-30: qty 30

## 2017-07-30 MED ORDER — KETOROLAC TROMETHAMINE 30 MG/ML IJ SOLN
INTRAMUSCULAR | Status: AC
Start: 2017-07-30 — End: 2017-07-30
  Filled 2017-07-30: qty 1

## 2017-07-30 MED ORDER — ONDANSETRON HCL 4 MG/2ML IJ SOLN
INTRAMUSCULAR | Status: DC | PRN
  Administered 2017-07-30: 4 mg via INTRAVENOUS

## 2017-07-30 MED ORDER — LIDOCAINE-EPINEPHRINE (PF) 2 %-1:200000 IJ SOLN
INTRAMUSCULAR | Status: AC
  Filled 2017-07-30: qty 20

## 2017-07-30 SURGICAL SUPPLY — 29 items
BENZOIN TINCTURE PRP APPL 2/3 (GAUZE/BANDAGES/DRESSINGS) ×3 IMPLANT
BLADE SURG 11 STRL SS (BLADE) ×3 IMPLANT
CLOSURE WOUND 1/2 X4 (GAUZE/BANDAGES/DRESSINGS) ×1
CLOTH BEACON ORANGE TIMEOUT ST (SAFETY) ×3 IMPLANT
CONTAINER PREFILL 10% NBF 15ML (MISCELLANEOUS) ×6 IMPLANT
DRSG OPSITE 4X5.5 SM (GAUZE/BANDAGES/DRESSINGS) ×3 IMPLANT
DRSG OPSITE POSTOP 3X4 (GAUZE/BANDAGES/DRESSINGS) ×3 IMPLANT
DURAPREP 26ML APPLICATOR (WOUND CARE) ×3 IMPLANT
ELECT REM PT RETURN 9FT ADLT (ELECTROSURGICAL) ×3
ELECTRODE REM PT RTRN 9FT ADLT (ELECTROSURGICAL) ×1 IMPLANT
GLOVE BIO SURGEON STRL SZ 6.5 (GLOVE) ×4 IMPLANT
GLOVE BIO SURGEONS STRL SZ 6.5 (GLOVE) ×2
GLOVE BIOGEL PI IND STRL 7.0 (GLOVE) ×1 IMPLANT
GLOVE BIOGEL PI INDICATOR 7.0 (GLOVE) ×2
GOWN STRL REUS W/TWL LRG LVL3 (GOWN DISPOSABLE) ×6 IMPLANT
NEEDLE HYPO 22GX1.5 SAFETY (NEEDLE) IMPLANT
NS IRRIG 1000ML POUR BTL (IV SOLUTION) ×3 IMPLANT
PACK ABDOMINAL MINOR (CUSTOM PROCEDURE TRAY) ×3 IMPLANT
PENCIL BUTTON HOLSTER BLD 10FT (ELECTRODE) IMPLANT
PROTECTOR NERVE ULNAR (MISCELLANEOUS) ×3 IMPLANT
SPONGE LAP 4X18 X RAY DECT (DISPOSABLE) ×3 IMPLANT
STRIP CLOSURE SKIN 1/2X4 (GAUZE/BANDAGES/DRESSINGS) ×2 IMPLANT
SUT PLAIN 0 NONE (SUTURE) ×3 IMPLANT
SUT VIC AB 0 CT1 27 (SUTURE) ×2
SUT VIC AB 0 CT1 27XBRD ANBCTR (SUTURE) ×1 IMPLANT
SUT VIC AB 3-0 FS2 27 (SUTURE) ×3 IMPLANT
SYR CONTROL 10ML LL (SYRINGE) IMPLANT
TOWEL OR 17X24 6PK STRL BLUE (TOWEL DISPOSABLE) ×6 IMPLANT
TRAY FOLEY CATH SILVER 14FR (SET/KITS/TRAYS/PACK) ×3 IMPLANT

## 2017-07-30 NOTE — Anesthesia Postprocedure Evaluation (Signed)
Anesthesia Post Note  Patient: Rudy Domek  Procedure(s) Performed: POST PARTUM TUBAL LIGATION (Bilateral )     Patient location during evaluation: Mother Baby Anesthesia Type: Epidural Level of consciousness: awake and alert Pain management: pain level controlled Vital Signs Assessment: post-procedure vital signs reviewed and stable Respiratory status: spontaneous breathing, nonlabored ventilation and respiratory function stable Cardiovascular status: stable Postop Assessment: no headache, no backache, epidural receding and patient able to bend at knees Anesthetic complications: no    Last Vitals:  Vitals:   07/30/17 1205 07/30/17 1613  BP: 109/67 (!) 111/56  Pulse: 68 68  Resp: 16 17  Temp: (!) 36.4 C 36.8 C  SpO2:      Last Pain:  Vitals:   07/30/17 1613  TempSrc: Axillary  PainSc:    Pain Goal: Patients Stated Pain Goal: 0 (07/29/17 1942)               Rica Records

## 2017-07-30 NOTE — Anesthesia Postprocedure Evaluation (Signed)
Anesthesia Post Note  Patient: Hannah Burch  Procedure(s) Performed: Procedure(s) (LRB): POST PARTUM TUBAL LIGATION (Bilateral)     Patient location during evaluation: PACU Anesthesia Type: Epidural Level of consciousness: awake and alert, patient cooperative and oriented Pain management: pain level controlled Vital Signs Assessment: post-procedure vital signs reviewed and stable Respiratory status: spontaneous breathing, nonlabored ventilation and respiratory function stable Cardiovascular status: blood pressure returned to baseline and stable Postop Assessment: patient able to bend at knees, epidural receding and no apparent nausea or vomiting Anesthetic complications: no    Last Vitals:  Vitals:   07/30/17 0946 07/30/17 1112  BP: 118/70 111/72  Pulse: 83 70  Resp: 15 15  Temp: 37.2 C 36.8 C  SpO2: 100%     Last Pain:  Vitals:   07/30/17 1112  TempSrc: Axillary  PainSc: 0-No pain   Pain Goal: Patients Stated Pain Goal: 0 (07/29/17 1942)               Erling Cruz. Kloey Cazarez

## 2017-07-30 NOTE — Anesthesia Preprocedure Evaluation (Signed)
Anesthesia Evaluation  Patient identified by MRN, date of birth, ID band Patient awake    Reviewed: Allergy & Precautions, NPO status , Patient's Chart, lab work & pertinent test results  History of Anesthesia Complications Negative for: history of anesthetic complications  Airway Mallampati: II  TM Distance: >3 FB Neck ROM: Full    Dental  (+) Dental Advisory Given   Pulmonary former smoker,    breath sounds clear to auscultation       Cardiovascular negative cardio ROS   Rhythm:Regular Rate:Normal     Neuro/Psych negative neurological ROS     GI/Hepatic negative GI ROS, Neg liver ROS,   Endo/Other  negative endocrine ROS  Renal/GU negative Renal ROS     Musculoskeletal   Abdominal   Peds  Hematology plt 291k   Anesthesia Other Findings   Reproductive/Obstetrics Post-partum, NSVD this am Breast-feeding                             Anesthesia Physical Anesthesia Plan  ASA: II  Anesthesia Plan: Epidural   Post-op Pain Management:    Induction:   PONV Risk Score and Plan: 2 and Ondansetron, Dexamethasone and Treatment may vary due to age or medical condition  Airway Management Planned: Natural Airway  Additional Equipment:   Intra-op Plan:   Post-operative Plan:   Informed Consent: I have reviewed the patients History and Physical, chart, labs and discussed the procedure including the risks, benefits and alternatives for the proposed anesthesia with the patient or authorized representative who has indicated his/her understanding and acceptance.   Dental advisory given  Plan Discussed with:   Anesthesia Plan Comments: (Plan: pre-existing Epidural)        Anesthesia Quick Evaluation

## 2017-07-30 NOTE — Addendum Note (Signed)
Addendum  created 07/30/17 1732 by Rica Records, CRNA   Sign clinical note

## 2017-07-30 NOTE — Transfer of Care (Signed)
Immediate Anesthesia Transfer of Care Note  Patient: Hannah Burch  Procedure(s) Performed: Procedure(s): POST PARTUM TUBAL LIGATION (Bilateral)  Patient Location: PACU  Anesthesia Type:Epidural  Level of Consciousness: awake, alert  and oriented  Airway & Oxygen Therapy: Patient Spontanous Breathing  Post-op Assessment: Report given to RN and Post -op Vital signs reviewed and stable  Post vital signs: Reviewed and stable  Last Vitals:  Vitals:   07/30/17 0830 07/30/17 0845  BP: 116/73 121/72  Pulse: (!) 102 87  Resp: 16 16  Temp:    SpO2:      Last Pain:  Vitals:   07/30/17 0845  TempSrc:   PainSc: 0-No pain      Patients Stated Pain Goal: 0 (07/29/17 1942)  Complications: No apparent anesthesia complications

## 2017-07-30 NOTE — Progress Notes (Signed)
Patient ID: Hannah Burch, female   DOB: 1986-03-31, 31 y.o.   MRN: [id number] Called to room for deceleration Usual measures being undertaken by RN  FHR in 80s x 6-7 minutes  Cervix was 6cm/80/-1 vertex by my exam  Terb given to rest uterus ISE applied  Return to 130s Dr Ellyn Hack on phone while we did these things  Will watch and wait for now  Aviva Signs, CNM

## 2017-07-30 NOTE — Progress Notes (Signed)
Patient ID: Hannah Burch, female   DOB: 10-30-1986, 31 y.o.   MRN: [id number]  Pt with decels.  Pt had period of variables and prolonged ctx at 1:30 treated with FSE and terb Received call - pt with variables/lates.   RN changing position, IV bolus. Recovery without terbutaline  Again, prolonged variables. IUPC placed.  Ctx are very much decreased  A few more decels, Pt given ephedrine d/t decreased BP Recovery to 130's, mod var, + accels.  Category 1 now had been category 2  SVE 7.8/80/-1-2  Will continue close monitoring toco q 2-39min   Will likely augment with pitocin as mVu are inadequate.

## 2017-07-30 NOTE — Anesthesia Postprocedure Evaluation (Signed)
Anesthesia Post Note  Patient: Hannah Burch  Procedure(s) Performed: * No procedures listed *     Patient location during evaluation: L&D Anesthesia Type: Epidural Level of consciousness: awake and alert, patient cooperative and oriented Pain management: pain level controlled Vital Signs Assessment: post-procedure vital signs reviewed and stable Respiratory status: spontaneous breathing, nonlabored ventilation and respiratory function stable Cardiovascular status: stable Postop Assessment: no backache, epidural receding, patient able to bend at knees and no apparent nausea or vomiting Anesthetic complications: no Comments: Proceeding to BTL with existing epidural catheter    Last Vitals:  Vitals:   07/30/17 0946 07/30/17 1112  BP: 118/70 111/72  Pulse: 83 70  Resp: 15 15  Temp: 37.2 C 36.8 C  SpO2: 100%     Last Pain:  Vitals:   07/30/17 1112  TempSrc: Axillary  PainSc: 0-No pain   Pain Goal: Patients Stated Pain Goal: 0 (07/29/17 1942)               Erling Cruz. Hannah Burch

## 2017-07-30 NOTE — Brief Op Note (Signed)
07/29/2017 - 07/30/2017  9:41 AM  PATIENT:  Hannah Burch  31 y.o. female  PRE-OPERATIVE DIAGNOSIS:  undesired fertility  POST-OPERATIVE DIAGNOSIS:  undesired fertility   PROCEDURE:  Procedure(s): POST PARTUM TUBAL LIGATION (Bilateral)  SURGEON:  Surgeon(s) and Role:    * Bovard-Stuckert, Benjamin Casanas, MD - Primary  ANESTHESIA:   local and epidural redosed  EBL:  Total I/O In: 700 [I.V.:700] Out: 1455 [Urine:1250; Blood:205]  BLOOD ADMINISTERED:none  DRAINS: Urinary Catheter (Foley) to PACU   LOCAL MEDICATIONS USED:  MARCAINE     SPECIMEN:  Source of Specimen:  B tubal segments  DISPOSITION OF SPECIMEN:  PATHOLOGY  COUNTS:  YES  TOURNIQUET:  * No tourniquets in log *  DICTATION: .Other Dictation: Dictation Number [id number]  PLAN OF CARE: Admit to inpatient   PATIENT DISPOSITION:  PACU - hemodynamically stable.   Delay start of Pharmacological VTE agent (>24hrs) due to surgical blood loss or risk of bleeding: not applicable

## 2017-07-30 NOTE — Progress Notes (Signed)
Patient ID: Hannah Burch, female   DOB: Jan 09, 1986, 31 y.o.   MRN: [id number]   Pt with late and prolonged decels  Prolonged for 9-64minutes Recovery to 140's, mod var, + accels - category 1 now, was category 2-3 toco q  D/W pt at length r/b/a of LTCS for delivery.  Questions answered.    SVE 8/90/0  For right now continue labor, close monitoring

## 2017-07-31 ENCOUNTER — Inpatient Hospital Stay (HOSPITAL_COMMUNITY): Admission: RE | Admit: 2017-07-31 | Payer: 59 | Source: Ambulatory Visit

## 2017-07-31 LAB — CBC
HEMATOCRIT: 24.5 % — AB (ref 36.0–46.0)
Hemoglobin: 8 g/dL — ABNORMAL LOW (ref 12.0–15.0)
MCH: 26.9 pg (ref 26.0–34.0)
MCHC: 32.7 g/dL (ref 30.0–36.0)
MCV: 82.5 fL (ref 78.0–100.0)
Platelets: 210 10*3/uL (ref 150–400)
RBC: 2.97 MIL/uL — AB (ref 3.87–5.11)
RDW: 15.3 % (ref 11.5–15.5)
WBC: 16.5 10*3/uL — AB (ref 4.0–10.5)

## 2017-07-31 NOTE — Progress Notes (Signed)
Post Partum Day 1/POD 1 ppBTL Subjective: no complaints and tolerating PO  Objective: Blood pressure (!) 100/59, pulse 61, temperature 98.2 F (36.8 C), temperature source Axillary, resp. rate 16, height 5' (1.524 m), weight 62.1 kg (137 lb), last menstrual period 09/20/2016, SpO2 100 %, unknown if currently breastfeeding.  Physical Exam:  General: alert and cooperative Lochia: appropriate Uterine Fundus: firm Incision: C/D/I    Recent Labs  07/29/17 2020 07/31/17 0554  HGB 10.6* 8.0*  HCT 32.4* 24.5*    Assessment/Plan: Plan for discharge tomorrow  D/w pt circumcision and planning in the office   LOS: 2 days   Oliver Pila 07/31/2017, 10:11 AM

## 2017-07-31 NOTE — Progress Notes (Signed)
UR chart review completed.  

## 2017-07-31 NOTE — Plan of Care (Signed)
Problem: Nutritional: Goal: Dietary intake will improve Outcome: Progressing Mom having periods of nausea, possibly medication related.  Is eating regular diet.

## 2017-07-31 NOTE — Op Note (Signed)
Hannah Burch, Hannah Burch              ACCOUNT NO.:  [id number]  MEDICAL RECORD NO.:  [id number]  LOCATION:                                 FACILITY:  PHYSICIAN:  Sherron Monday, MD             DATE OF BIRTH:  DATE OF PROCEDURE:  07/30/2017 DATE OF DISCHARGE:                              OPERATIVE REPORT   PREOPERATIVE DIAGNOSIS:  Status post term vaginal delivery with undesired fertility.  POSTOPERATIVE DIAGNOSE:  Status post term vaginal delivery with undesired fertility.  PROCEDURE:  Postpartum bilateral tubal ligation by Pomeroy method.  SURGEON:  Sherron Monday, MD.  ANESTHESIA:  Redosing of her epidural and 10 mL of Marcaine for local anesthetic.  IV FLUIDS:  700 mL.  URINE OUTPUT:  250 mL clear urine.  This was left after labor.  ESTIMATED BLOOD LOSS:  205 mL.  This includes her blood loss with labor and delivery.  COMPLICATIONS:  None.  PATHOLOGY:  Bilateral tubal segments.  DESCRIPTION OF PROCEDURE:  After informed consent was reviewed with the patient and her family, she was transported to the OR as her epidural was being redosed.  When the dosing was found to be adequate, she was prepped and draped in the normal sterile fashion.  A Foley catheter had previously sterilely been placed.  Her abdomen was prepped with DuraPrep for 3 minutes to dry.  She was then draped and another test of the density of her epidural was successful, and approximately 2 cm infraumbilical incision was made, carried through the underlying layer of fascia sharply.  The fascia was then elevated with Kocher clamps and incised with Mayo scissors.  This incision was extended with the Mayo scissors.  The peritoneum was entered bluntly.  The uterus was easily palpated and the tubes were followed out to the fimbriated end, first on the left, elevating it with Singley pickups and then a Tanja Port was used to hold the [employer].  The tubal was performed in a Pomeroy fashion.  The tube was doubly ligated  and the intervening portion was excised and sent to Pathology and this was found to be hemostatic.  A similar procedure was performed on the right.  The tube was identified, followed out to the fimbriated end, elevated with a Babcock and doubly ligated with 0 plain gut.  The intervening tube was excised.  It was noted to be hemostatic.  The fascia was reapproximated with 0 Vicryl.  The dead space in the subcuticular tissue was closed with 2-0 Vicryl.  The skin was closed with 4-0 Vicryl in a subcu fashion.  Benzoin and Steri-Strips were applied.  The patient tolerated the procedure well.  Sponge, lap, and needle count was correct x2 per the operating staff.     Sherron Monday, MD   ______________________________ Sherron Monday, MD    JB/MEDQ  D:  07/30/2017  T:  07/30/2017  Job:  [id number]

## 2017-07-31 NOTE — Progress Notes (Signed)
Swelling under incision reassessed.  Fundis is firm with massage and bleeding is minimal.  Ice applied to tender spot and mylicon given PO.  MD notified of change in status.

## 2017-07-31 NOTE — Lactation Note (Signed)
This note was copied from a baby's chart. Lactation Consultation Note  Patient Name: Boy Lilliah Priego [id number] Date: 07/31/2017   P4, Bf other children for approx 3 months. Oral assessment indicated this baby has short anterior lingual frenulum.  Suggest discussing with Pediatrician. Mother breastfeeding upon entering.  Mother states it is starting to get sore. When baby unlatched noted compression of the nipple. Demonstrated hand expression.  Encouraged applying colostrum on nipple for soreness and RN will give mother coconut oil. When baby relatched in cradle.  Repositioned mother to cross cradle and demonstrated how to perform chin tug to wide latch and create less pressure. Intermittent sucks and swallows observed. Mom encouraged to feed baby 8-12 times/24 hours and with feeding cues.  Reviewed basics. Mom made aware of O/P services, breastfeeding support groups, community resources, and our phone # for post-discharge questions.       Maternal Data    Feeding Feeding Type: Breast Fed Length of feed: 30 min  LATCH Score                   Interventions    Lactation Tools Discussed/Used     Consult Status      Hardie Pulley 07/31/2017, 9:35 AM

## 2017-07-31 NOTE — Progress Notes (Signed)
Pt c/o increased tenderness and pain at incision site of BLT.  Abdomen appears to be more swollen and edematous that assessment this am.  Pt reports she has been passing a lot of gas.  Fundus is firm and 2 below, minimal bleeding, pt encouraged to empty bladder before further assessment.

## 2017-08-01 MED ORDER — IBUPROFEN 800 MG PO TABS: 800.0000 mg | ORAL_TABLET | Freq: Three times a day (TID) | ORAL | 1 refills | Status: DC | PRN

## 2017-08-01 MED ORDER — PRENATAL MULTIVITAMIN CH: 1.0000 | ORAL_TABLET | Freq: Every day | ORAL | 3 refills | Status: DC

## 2017-08-01 MED ORDER — OXYCODONE HCL 5 MG PO TABS: 5.0000 mg | ORAL_TABLET | Freq: Four times a day (QID) | ORAL | 0 refills | Status: DC | PRN

## 2017-08-01 NOTE — Progress Notes (Signed)
Post Partum Day 2/Postop Day 2 (BTL) Subjective: no complaints, up ad lib, voiding, tolerating PO and nl lochia, pain controlled  Objective: Blood pressure (!) 100/50, pulse 65, temperature 99.1 F (37.3 C), resp. rate 18, height 5' (1.524 m), weight 62.1 kg (137 lb), last menstrual period 09/20/2016, SpO2 100 %, unknown if currently breastfeeding.  Physical Exam:  General: alert and no distress Lochia: appropriate Uterine Fundus: firm Incision: healing well DVT Evaluation: No evidence of DVT seen on physical exam.   Recent Labs  07/29/17 2020 07/31/17 0554  HGB 10.6* 8.0*  HCT 32.4* 24.5*    Assessment/Plan: Discharge home, Breastfeeding and Lactation consult.  Routine care.  D?C with Motrin, percocet and PNV.  F/u 6 weeks.     LOS: 3 days   Hannah Burch 08/01/2017, 7:52 AM

## 2017-08-01 NOTE — Lactation Note (Signed)
This note was copied from a baby's chart. Lactation Consultation Note Mom BF in side lying position. Mom states nipples are sore, coconut oil helpful. Baby cluster feeding. 4th baby. Stated bf going well. Has no questions or concerns. Mom has WIC. Mom stated baby cluster feeding, and sore from that.  Encouraged to call if has questions or concerns.  Discussed engorgement, prevention, management. Patient Name: Hannah Burch [id number] Date: 08/01/2017 Reason for consult: Follow-up assessment   Maternal Data    Feeding Feeding Type: Breast Fed Length of feed: 20 min  LATCH Score Latch: Grasps breast easily, tongue down, lips flanged, rhythmical sucking.  Audible Swallowing: Spontaneous and intermittent  Type of Nipple: Everted at rest and after stimulation  Comfort (Breast/Nipple): Filling, red/small blisters or bruises, mild/mod discomfort  Hold (Positioning): No assistance needed to correctly position infant at breast.  LATCH Score: 9  Interventions Interventions: Coconut oil;Comfort gels;Support pillows;Position options;Breast massage  Lactation Tools Discussed/Used     Consult Status Consult Status: Complete Date: 08/01/17 Follow-up type: In-patient    Aurielle Slingerland, Diamond Nickel 08/01/2017, 6:05 AM

## 2017-08-01 NOTE — Discharge Summary (Signed)
OB Discharge Summary     Patient Name: Hannah Burch DOB: 1985/11/12 MRN: [id number]  Date of admission: 07/29/2017 Delivering MD: Sherian Rein   Date of discharge: 08/01/2017  Admitting diagnosis: 39 WKS, CTXS desires sterilization Intrauterine pregnancy: [redacted]w[redacted]d     Secondary diagnosis:  Principal Problem:   SVD (spontaneous vaginal delivery) Active Problems:   Normal pregnancy in multigravida in third trimester   Pregnancy  Additional problems: N/A     Discharge diagnosis: Term Pregnancy Delivered                                                                                                Post partum procedures:postpartum tubal ligation  Augmentation: AROM and Pitocin  Complications: None  Hospital course:  Onset of Labor With Vaginal Delivery     31 y.o. yo [id number] at [redacted]w[redacted]d was admitted in Active Labor on 07/29/2017. Patient had an uncomplicated labor course as follows:  Membrane Rupture Time/Date: 11:09 PM ,07/29/2017   Intrapartum Procedures: Episiotomy: None [1]                                         Lacerations:  None [1]  Patient had a delivery of a Viable infant. 07/30/2017  Information for the patient's newborn:  Kendahl, [place]umgardner [[id number]]  Delivery Method: Vag-Spont    Pateint had an uncomplicated postpartum course.  She is ambulating, tolerating a regular diet, passing flatus, and urinating well. Patient is discharged home in stable condition on 08/01/17.   Physical exam  Vitals:   07/31/17 0000 07/31/17 0406 07/31/17 1802 08/01/17 0543  BP: (!) 101/58 (!) 100/59 (!) 95/53 (!) 100/50  Pulse: 64 61 66 65  Resp: Temp: 98.3 F (36.8 C) 98.2 F (36.8 C) 98.9 F (37.2 C) 99.1 F (37.3 C)  TempSrc: Axillary Axillary Oral   SpO2: 100% 100%    Weight:      Height:       General: alert and no distress Lochia: appropriate Uterine Fundus: firm Incision: Healing well with no significant drainage DVT Evaluation: No  evidence of DVT seen on physical exam. Labs: Lab Results  Component Value Date   WBC 16.5 (H) 07/31/2017   HGB 8.0 (L) 07/31/2017   HCT 24.5 (L) 07/31/2017   MCV 82.5 07/31/2017   PLT 210 07/31/2017   No flowsheet data found.  Discharge instruction: per After Visit Summary and "Baby and Me Booklet".  After visit meds:  Allergies as of 08/01/2017   No Known Allergies     Medication List    STOP taking these medications   miconazole 2 % vaginal cream Commonly known as:  MONISTAT 7     TAKE these medications   ibuprofen 800 MG tablet Commonly known as:  ADVIL,MOTRIN Take 1 tablet (800 mg total) by mouth every 8 (eight) hours as needed.   oxyCODONE 5 MG immediate release tablet Commonly known as:  Oxy IR/ROXICODONE Take 1 tablet (5 mg total) by mouth  every 6 (six) hours as needed (pain scale 4-7). 1   prenatal multivitamin Tabs tablet Take 1 tablet by mouth daily at 12 noon.       Diet: routine diet  Activity: Advance as tolerated. Pelvic rest for 6 weeks.   Outpatient follow up:6 weeks Follow up Appt:No future appointments. Follow up Visit:No Follow-up on file.  Postpartum contraception: Tubal Ligation  Newborn Data: Live born female  Birth Weight: 7 lb 0.3 oz (3184 g) APGAR: 9, 9  Baby Feeding: Breast Disposition:home with mother   08/01/2017 Sherian Rein, MD

## 2017-08-04 ENCOUNTER — Encounter (HOSPITAL_COMMUNITY): Payer: Self-pay | Admitting: Student

## 2017-08-04 ENCOUNTER — Inpatient Hospital Stay (HOSPITAL_COMMUNITY)
Admission: AD | Admit: 2017-08-04 | Discharge: 2017-08-04 | Disposition: A | Discharge status: Home / Self Care | Payer: 59 | Source: Ambulatory Visit | Attending: Obstetrics and Gynecology | Admitting: Obstetrics and Gynecology

## 2017-08-04 ENCOUNTER — Inpatient Hospital Stay (HOSPITAL_COMMUNITY): Payer: 59

## 2017-08-04 DIAGNOSIS — Z87891 Personal history of nicotine dependence: Secondary | ICD-10-CM | POA: Diagnosis not present

## 2017-08-04 DIAGNOSIS — R109 Unspecified abdominal pain: Secondary | ICD-10-CM | POA: Diagnosis present

## 2017-08-04 DIAGNOSIS — Z9851 Tubal ligation status: Secondary | ICD-10-CM

## 2017-08-04 DIAGNOSIS — R1031 Right lower quadrant pain: Secondary | ICD-10-CM | POA: Diagnosis not present

## 2017-08-04 DIAGNOSIS — G8918 Other acute postprocedural pain: Secondary | ICD-10-CM

## 2017-08-04 LAB — URINALYSIS, ROUTINE W REFLEX MICROSCOPIC
Bacteria, UA: NONE SEEN
Bilirubin Urine: NEGATIVE
GLUCOSE, UA: NEGATIVE mg/dL
KETONES UR: NEGATIVE mg/dL
NITRITE: NEGATIVE
PH: 8 (ref 5.0–8.0)
Protein, ur: NEGATIVE mg/dL
SPECIFIC GRAVITY, URINE: 1.009 (ref 1.005–1.030)

## 2017-08-04 LAB — CBC
HEMATOCRIT: 28.6 % — AB (ref 36.0–46.0)
HEMOGLOBIN: 9.4 g/dL — AB (ref 12.0–15.0)
MCH: 27.1 pg (ref 26.0–34.0)
MCHC: 32.9 g/dL (ref 30.0–36.0)
MCV: 82.4 fL (ref 78.0–100.0)
Platelets: 274 10*3/uL (ref 150–400)
RBC: 3.47 MIL/uL — AB (ref 3.87–5.11)
RDW: 15.5 % (ref 11.5–15.5)
WBC: 8.3 10*3/uL (ref 4.0–10.5)

## 2017-08-04 MED ORDER — IBUPROFEN 600 MG PO TABS
600.0000 mg | ORAL_TABLET | Freq: Once | ORAL | Status: AC
  Administered 2017-08-04: 600 mg via ORAL
  Filled 2017-08-04: qty 1

## 2017-08-04 MED ORDER — IBUPROFEN 600 MG PO TABS: 600.0000 mg | ORAL_TABLET | Freq: Four times a day (QID) | ORAL | 0 refills | Status: DC

## 2017-08-04 NOTE — MAU Note (Signed)
Deliver 07/30/17 and had a tubal following delivery.  Since getting home developed a sharp pain in the lower ride side of her abdomen that comes and goes.  Has taken tylenol for the pain without relief.

## 2017-08-04 NOTE — Discharge Instructions (Signed)
Laparoscopic Tubal Ligation, Care After °Refer to this sheet in the next few weeks. These instructions provide you with information about caring for yourself after your procedure. Your health care provider may also give you more specific instructions. Your treatment has been planned according to current medical practices, but problems sometimes occur. Call your health care provider if you have any problems or questions after your procedure. °What can I expect after the procedure? °After the procedure, it is common to have: °· A sore throat. °· Discomfort in your shoulder. °· Mild discomfort or cramping in your abdomen. °· Gas pains. °· Pain or soreness in the area where the surgical cut (incision) was made. °· A bloated feeling. °· Tiredness. °· Nausea. °· Vomiting. ° °Follow these instructions at home: °Medicines °· Take over-the-counter and prescription medicines only as told by your health care provider. °· Do not take aspirin because it can cause bleeding. °· Do not drive or operate heavy machinery while taking prescription pain medicine. °Activity °· Rest for the rest of the day. °· Return to your normal activities as told by your health care provider. Ask your health care provider what activities are safe for you. °Incision care ° °· Follow instructions from your health care provider about how to take care of your incision. Make sure you: °? Wash your hands with soap and water before you change your bandage (dressing). If soap and water are not available, use hand sanitizer. °? Change your dressing as told by your health care provider. °? Leave stitches (sutures) in place. They may need to stay in place for 2 weeks or longer. °· Check your incision area every day for signs of infection. Check for: °? More redness, swelling, or pain. °? More fluid or blood. °? Warmth. °? Pus or a bad smell. °Other Instructions °· Do not take baths, swim, or use a hot tub until your health care provider approves. You may take  showers. °· Keep all follow-up visits as told by your health care provider. This is important. °· Have someone help you with your daily household tasks for the first few days. °Contact a health care provider if: °· You have more redness, swelling, or pain around your incision. °· Your incision feels warm to the touch. °· You have pus or a bad smell coming from your incision. °· The edges of your incision break open after the sutures have been removed. °· Your pain does not improve after 2-3 days. °· You have a rash. °· You repeatedly become dizzy or light-headed. °· Your pain medicine is not helping. °· You are constipated. °Get help right away if: °· You have a fever. °· You faint. °· You have increasing pain in your abdomen. °· You have severe pain in one or both of your shoulders. °· You have fluid or blood coming from your sutures or from your vagina. °· You have shortness of breath or difficulty breathing. °· You have chest pain or leg pain. °· You have ongoing nausea, vomiting, or diarrhea. °This information is not intended to replace advice given to you by your health care provider. Make sure you discuss any questions you have with your health care provider. °Document Released: 05/06/2005 Document Revised: 03/21/2016 Document Reviewed: 09/27/2015 °Elsevier Interactive Patient Education © 2018 [employer] ° °

## 2017-08-04 NOTE — MAU Provider Note (Signed)
History     CSN: [id number]  Arrival date and time: 08/04/17 4098  First Provider Initiated Contact with Patient 08/04/17 [phone]      Chief Complaint  Patient presents with  . Abdominal Pain   HPI Hannah Burch is a 31 y.o. [id number] female who presents with abdominal pain. Patient had SVD followed by BTL on 9/30. Reports RLQ pain since the day she was discharged on 10/2. Pain is constant, but worse with position changes & movement. Describes as sharp pain that she rates 10/10. Has taken tylenol without relief. Was rx oxycodone but hasn't taken it d/t side effects & as not taken her rx strength ibuprofen. Denies fever/chill, n/v/d, constipation, or dysuria. Last BM was this morning. Reports small amount of tan lochia. No bright red bleeding.   OB History    Gravida Para Term Preterm AB Living   SAB TAB Ectopic Multiple Live Births   1 1 0 0 4      Past Medical History:  Diagnosis Date  . Preterm delivery   . SVD (spontaneous vaginal delivery) 07/30/2017    Past Surgical History:  Procedure Laterality Date  . TUBAL LIGATION Bilateral 07/30/2017   Procedure: POST PARTUM TUBAL LIGATION;  Surgeon: Sherian Rein, MD;  Location: [facility];  Service: Gynecology;  Laterality: Bilateral;    Family History  Problem Relation Age of Onset  . Hypertension Mother   . Diabetes Father     Social History  Substance Use Topics  . Smoking status: Former Games developer  . Smokeless tobacco: Never Used  . Alcohol use No    Allergies: No Known Allergies  Prescriptions Prior to Admission  Medication Sig Dispense Refill Last Dose  . ibuprofen (ADVIL,MOTRIN) 800 MG tablet Take 1 tablet (800 mg total) by mouth every 8 (eight) hours as needed. 45 tablet 1   . oxyCODONE (OXY IR/ROXICODONE) 5 MG immediate release tablet Take 1 tablet (5 mg total) by mouth every 6 (six) hours as needed (pain scale 4-7). 1 5 tablet 0   . Prenatal Vit-Fe Fumarate-FA (PRENATAL  MULTIVITAMIN) TABS tablet Take 1 tablet by mouth daily at 12 noon. 100 tablet 3     Review of Systems  Constitutional: Negative for chills and fever.  Gastrointestinal: Positive for abdominal pain. Negative for constipation, diarrhea, nausea and vomiting.  Genitourinary: Positive for vaginal discharge. Negative for dysuria and vaginal bleeding.   Physical Exam   Blood pressure 126/74, pulse 64, temperature 98.8 F (37.1 C), temperature source Oral, resp. rate 19, last menstrual period 09/20/2016, unknown if currently breastfeeding.  Physical Exam  Nursing note and vitals reviewed. Constitutional: She is oriented to person, place, and time. She appears well-developed and well-nourished. No distress.  HENT:  Head: Normocephalic and atraumatic.  Eyes: Conjunctivae are normal. Right eye exhibits no discharge. Left eye exhibits no discharge. No scleral icterus.  Neck: Normal range of motion.  Cardiovascular: Normal rate, regular rhythm and normal heart sounds.   No murmur heard. Respiratory: Effort normal and breath sounds normal. No respiratory distress. She has no wheezes.  GI: Soft. Bowel sounds are normal. She exhibits no distension. There is tenderness in the right lower quadrant. There is no rebound and no guarding.  Neurological: She is alert and oriented to person, place, and time.  Skin: Skin is warm and dry. She is not diaphoretic.  Psychiatric: She has a normal mood and affect. Her behavior is normal. Judgment and thought content normal.  MAU Course  Procedures Results for orders placed or performed during the hospital encounter of 08/04/17 (from the past 48 hour(s))  Urinalysis, Routine w reflex microscopic     Status: Abnormal   Collection Time: 08/04/17  2:51 AM  Result Value Ref Range   Color, Urine STRAW (A) YELLOW   APPearance HAZY (A) CLEAR   Specific Gravity, Urine 1.009 1.005 - 1.030   pH 8.0 5.0 - 8.0   Glucose, UA NEGATIVE NEGATIVE mg/dL   Hgb urine dipstick  LARGE (A) NEGATIVE   Bilirubin Urine NEGATIVE NEGATIVE   Ketones, ur NEGATIVE NEGATIVE mg/dL   Protein, ur NEGATIVE NEGATIVE mg/dL   Nitrite NEGATIVE NEGATIVE   Leukocytes, UA TRACE (A) NEGATIVE   RBC / HPF 6-30 0 - 5 RBC/hpf   WBC, UA 0-5 0 - 5 WBC/hpf   Bacteria, UA NONE SEEN NONE SEEN   Squamous Epithelial / LPF 0-5 (A) NONE SEEN   Mucus PRESENT   CBC     Status: Abnormal   Collection Time: 08/04/17  3:48 AM  Result Value Ref Range   WBC 8.3 4.0 - 10.5 K/uL   RBC 3.47 (L) 3.87 - 5.11 MIL/uL   Hemoglobin 9.4 (L) 12.0 - 15.0 g/dL   HCT 46.9 (L) 62.9 - 52.8 %   MCV 82.4 78.0 - 100.0 fL   MCH 27.1 26.0 - 34.0 pg   MCHC 32.9 30.0 - 36.0 g/dL   RDW 41.3 24.4 - 01.0 %   Platelets 274 150 - 400 K/uL   US Pelvis (transabdominal Only)  Result Date: 08/04/2017 CLINICAL DATA:  Right lower quadrant pain. Spontaneous vaginal delivery and tubal ligations on 07/30/2017. EXAM: TRANSABDOMINAL ULTRASOUND OF PELVIS TECHNIQUE: Transabdominal ultrasound examination of the pelvis was performed including evaluation of the uterus, ovaries, adnexal regions, and pelvic cul-de-sac. COMPARISON:  None. FINDINGS: Uterus Measurements: 18.9 x 9.4 x 13.3 cm. Uterus is enlarged consistent with recent postpartum state. No focal myometrial mass lesions identified. Endometrium Thickness: 15.2 mm. Small amount of fluid in the endometrium. Heterogeneous appearance of the endometrium with flow demonstrated on color flow Doppler imaging. Can't exclude retained products of conception. Right ovary Measurements: 3.7 x 1.9 x 2.1 cm. Normal appearance/no adnexal mass. Left ovary Measurements: 3.9 x 1.7 x 2.2 cm. Normal appearance/no adnexal mass. Other findings:  No abnormal free fluid. IMPRESSION: Enlarged uterus consistent with recent postpartum state. Heterogeneous appearance of the endometrium with flow suggested on color flow Doppler imaging. Can't exclude retained products of conception. Electronically Signed   By: Burman Nieves M.D.   On: 08/04/2017 04:26    MDM VSS, NAD Ibuprofen 600 mg PO --- reports improvement in symptoms, 10>5 CBC, no leukocytosis & pt afebrile Ultrasound, small amount of blood in uterus.  On exam; BS x4 & abdomen soft; patient has tenderness in RLQ, no uterine tenderness C/w Dr. Mindi Slicker. Ok to discharge home. Patient to take ibuprofen 600 mg Q6 hrs on scheduled x 2-3 days & call Dr. Ellyn Hack for f/u if pain continues  Assessment and Plan  A: 1. Postoperative pain   2. History of bilateral tubal ligation    P: Discharge home Rx ibuprofen 600 mg -- discussed taking on a schedule  Discussed reasons to return to MAU F/u with Dr. Ellyn Hack as needed  Judeth Horn 08/04/2017, 3:13 AM

## 2017-08-25 ENCOUNTER — Ambulatory Visit (INDEPENDENT_AMBULATORY_CARE_PROVIDER_SITE_OTHER): Payer: 59 | Admitting: Urgent Care

## 2017-08-25 ENCOUNTER — Encounter: Payer: Self-pay | Admitting: Urgent Care

## 2017-08-25 VITALS — BP 120/88 | HR 68 | Temp 98.9°F | Resp 16 | Ht 60.0 in | Wt 130.6 lb

## 2017-08-25 DIAGNOSIS — R109 Unspecified abdominal pain: Secondary | ICD-10-CM | POA: Diagnosis not present

## 2017-08-25 DIAGNOSIS — E860 Dehydration: Secondary | ICD-10-CM

## 2017-08-25 DIAGNOSIS — R319 Hematuria, unspecified: Secondary | ICD-10-CM

## 2017-08-25 LAB — POCT URINALYSIS DIP (MANUAL ENTRY)
BILIRUBIN UA: NEGATIVE mg/dL
Bilirubin, UA: NEGATIVE
GLUCOSE UA: NEGATIVE mg/dL
Leukocytes, UA: NEGATIVE
Nitrite, UA: NEGATIVE
Protein Ur, POC: NEGATIVE mg/dL
Urobilinogen, UA: 0.2 E.U./dL
pH, UA: 5.5 (ref 5.0–8.0)

## 2017-08-25 LAB — POC MICROSCOPIC URINALYSIS (UMFC): MUCUS RE: ABSENT

## 2017-08-25 MED ORDER — CEPHALEXIN 500 MG PO CAPS
500.0000 mg | ORAL_CAPSULE | Freq: Two times a day (BID) | ORAL | 0 refills | Status: DC
Start: 2017-08-25 — End: 2018-10-17

## 2017-08-25 NOTE — Patient Instructions (Addendum)
Flank Pain Flank pain is pain in your side. The flank is the area of your side between your upper belly (abdomen) and your back. The pain may occur over a short period of time (acute) or may be long-term or come back often (chronic). It may be mild or very bad. Pain in this area can be caused by many different things. Follow these instructions at home:  Rest as told by your doctor.  Drink enough fluid to keep your pee (urine) clear or pale yellow.  Take over-the-counter and prescription medicines only as told by your doctor.  Keep all follow-up visits as told by your doctor. This is important. Contact a doctor if:  Medicine does not help your pain.  You have new symptoms.  Your pain gets worse.  You have a fever.  Your symptoms last longer than 2-3 days. Get help right away if:  Your tummy hurts or is swollen.  You are short of breath.  You feel sick to your stomach (nauseous) and it does not go away.  You cannot stop throwing up (vomiting).  You feel like you will pass out or you do pass out (faint).  You have blood in your pee.  You have a fever and your symptoms suddenly get worse. This information is not intended to replace advice given to you by your health care provider. Make sure you discuss any questions you have with your health care provider. Document Released: 07/26/2008 Document Revised: 07/08/2016 Document Reviewed: 07/21/2015 Elsevier Interactive Patient Education  2018 Elsevier Inc.     IF you received an x-ray today, you will receive an invoice from [facility]. Please contact [facility] at [phone] with questions or concerns regarding your invoice.   IF you received labwork today, you will receive an invoice from [facility]. Please contact [facility] at [phone] with questions or concerns regarding your invoice.   Our billing staff will not be able to assist you with questions regarding bills from these companies.  You will be  contacted with the lab results as soon as they are available. The fastest way to get your results is to activate your My Chart account. Instructions are located on the last page of this paperwork. If you have not heard from us regarding the results in 2 weeks, please contact this office.      

## 2017-08-25 NOTE — Progress Notes (Signed)
    MRN: [id number] DOB: 1986/10/03  Subjective:   Snow Marta LamasButler [id number] is a [age] y.o. female presenting for chief complaint of Flank Pain (left back pain noticed yesterday )  Reports 1 day history of left flank pain, urinary frequency, malodorous urine. Has tried ibuprofen with good relief. Has a history of renal stones when in high school. Had an UTI mid 07/2017, was treated for this with antibiotic course. Her symptoms were worse then, had fever, chills, right flank pain. Denies dysuria, hematuria, urinary urgency, abdominal pain, pelvic pain, genital rash and vaginal discharge, nausea and vomiting. Gave birth 07/30/2017, had tubal ligation then.   Davie has a current medication list which includes the following prescription(s): ibuprofen and prenatal multivitamin. Patient has No Known Allergies. Alashia  has a past medical history of Preterm delivery and SVD (spontaneous vaginal delivery) (07/30/2017). Also  has a past surgical history that includes Tubal ligation (Bilateral, 07/30/2017).  Objective:   Vitals: BP 120/88   Pulse 68   Temp 98.9 F (37.2 C)   Resp 16   Ht 5' (1.524 m)   Wt 130 lb 9.6 oz (59.2 kg)   SpO2 99%   BMI 25.51 kg/m   Physical Exam  Constitutional: She is oriented to person, place, and time. She appears well-developed and well-nourished.  Cardiovascular: Normal rate, regular rhythm and intact distal pulses.  Exam reveals no gallop and no friction rub.   No murmur heard. Pulmonary/Chest: No respiratory distress. She has no wheezes. She has no rales.  Abdominal: Soft. Bowel sounds are normal. She exhibits no distension and no mass. There is no tenderness.  No CVA tenderness.  Musculoskeletal: She exhibits no edema.  Neurological: She is alert and oriented to person, place, and time.  Skin: Skin is warm and dry. No rash noted. No erythema. No pallor.   Results for orders placed or performed in visit on 08/25/17 (from the past 24 hour(s))  POCT urinalysis  dipstick     Status: Abnormal   Collection Time: 08/25/17  5:39 PM  Result Value Ref Range   Color, UA yellow yellow   Clarity, UA clear clear   Glucose, UA negative negative mg/dL   Bilirubin, UA negative negative   Ketones, POC UA negative negative mg/dL   Spec Grav, UA >=4.098>=1.030 (A) 1.010 - 1.025   Blood, UA moderate (A) negative   pH, UA 5.5 5.0 - 8.0   Protein Ur, POC negative negative mg/dL   Urobilinogen, UA 0.2 0.2 or 1.0 E.U./dL   Nitrite, UA Negative Negative   Leukocytes, UA Negative Negative  POCT Microscopic Urinalysis ([facility])     Status: Abnormal   Collection Time: 08/25/17  5:40 PM  Result Value Ref Range   WBC,UR,HPF,POC Moderate (A) None WBC/hpf   RBC,UR,HPF,POC Moderate (A) None RBC/hpf   Bacteria Few (A) None, Too numerous to count   Mucus Absent Absent   Epithelial Cells, UR Per Microscopy Few (A) None, Too numerous to count cells/hpf    Assessment and Plan :   1. Flank pain 2. Hematuria, unspecified type 3. Dehydration - Urine culture pending. Will cover for cystitis. Patient is to hydrate much better than she has been. Return-to-clinic precautions discussed, patient verbalized understanding.   Wallis BambergMario Lulu Hirschmann, PA-C [facility] [phone] 08/25/2017 5:35 PM

## 2017-08-27 LAB — URINE CULTURE

## 2018-10-17 ENCOUNTER — Emergency Department (HOSPITAL_COMMUNITY): Payer: Medicaid Other

## 2018-10-17 ENCOUNTER — Emergency Department (HOSPITAL_COMMUNITY)
Admission: EM | Admit: 2018-10-17 | Discharge: 2018-10-17 | Disposition: A | Discharge status: Home / Self Care | Payer: Medicaid Other | Attending: Emergency Medicine | Admitting: Emergency Medicine

## 2018-10-17 ENCOUNTER — Encounter (HOSPITAL_COMMUNITY): Payer: Self-pay

## 2018-10-17 ENCOUNTER — Other Ambulatory Visit: Payer: Self-pay

## 2018-10-17 DIAGNOSIS — Z87891 Personal history of nicotine dependence: Secondary | ICD-10-CM | POA: Diagnosis not present

## 2018-10-17 DIAGNOSIS — Z79899 Other long term (current) drug therapy: Secondary | ICD-10-CM | POA: Diagnosis not present

## 2018-10-17 DIAGNOSIS — N83201 Unspecified ovarian cyst, right side: Secondary | ICD-10-CM | POA: Diagnosis not present

## 2018-10-17 DIAGNOSIS — R1031 Right lower quadrant pain: Secondary | ICD-10-CM | POA: Diagnosis present

## 2018-10-17 LAB — COMPREHENSIVE METABOLIC PANEL
ALT: 13 U/L (ref 0–44)
ANION GAP: 8 (ref 5–15)
AST: 15 U/L (ref 15–41)
Albumin: 4.4 g/dL (ref 3.5–5.0)
Alkaline Phosphatase: 54 U/L (ref 38–126)
BUN: 10 mg/dL (ref 6–20)
CO2: 26 mmol/L (ref 22–32)
Calcium: 8.7 mg/dL — ABNORMAL LOW (ref 8.9–10.3)
Chloride: 105 mmol/L (ref 98–111)
Creatinine, Ser: 0.7 mg/dL (ref 0.44–1.00)
GFR calc Af Amer: >60 mL/min (ref >60)
GFR calc non Af Amer: >60 mL/min (ref >60)
Glucose, Bld: 94 mg/dL (ref 70–99)
Potassium: 3.5 mmol/L (ref 3.5–5.1)
Sodium: 139 mmol/L (ref 135–145)
Total Bilirubin: 0.6 mg/dL (ref 0.3–1.2)
Total Protein: 7.9 g/dL (ref 6.5–8.1)

## 2018-10-17 LAB — URINALYSIS, ROUTINE W REFLEX MICROSCOPIC
Bilirubin Urine: NEGATIVE
Glucose, UA: NEGATIVE mg/dL
Hgb urine dipstick: NEGATIVE
Ketones, ur: NEGATIVE mg/dL
Leukocytes, UA: NEGATIVE
Nitrite: NEGATIVE
Protein, ur: NEGATIVE mg/dL
Specific Gravity, Urine: >1.046 — ABNORMAL HIGH (ref 1.005–1.030)
pH: 8 (ref 5.0–8.0)

## 2018-10-17 LAB — LIPASE, BLOOD: Lipase: 30 U/L (ref 11–51)

## 2018-10-17 LAB — CBC
HCT: 38.9 % (ref 36.0–46.0)
Hemoglobin: 12.4 g/dL (ref 12.0–15.0)
MCH: 29.5 pg (ref 26.0–34.0)
MCHC: 31.9 g/dL (ref 30.0–36.0)
MCV: 92.6 fL (ref 80.0–100.0)
Platelets: 257 10*3/uL (ref 150–400)
RBC: 4.2 MIL/uL (ref 3.87–5.11)
RDW: 12.5 % (ref 11.5–15.5)
WBC: 6 10*3/uL (ref 4.0–10.5)
nRBC: 0 % (ref 0.0–0.2)

## 2018-10-17 LAB — I-STAT BETA HCG BLOOD, ED (MC, WL, AP ONLY): I-stat hCG, quantitative: <5 m[IU]/mL (ref <5)

## 2018-10-17 MED ORDER — IBUPROFEN 600 MG PO TABS: 600.0000 mg | ORAL_TABLET | Freq: Four times a day (QID) | ORAL | 0 refills | Status: DC | PRN

## 2018-10-17 MED ORDER — IOPAMIDOL (ISOVUE-300) INJECTION 61%
100.0000 mL | Freq: Once | INTRAVENOUS | Status: AC | PRN
  Administered 2018-10-17: 100 mL via INTRAVENOUS

## 2018-10-17 MED ORDER — SODIUM CHLORIDE (PF) 0.9 % IJ SOLN
INTRAMUSCULAR | Status: AC
  Filled 2018-10-17: qty 50

## 2018-10-17 MED ORDER — IOPAMIDOL (ISOVUE-300) INJECTION 61%
INTRAVENOUS | Status: AC
  Filled 2018-10-17: qty 100

## 2018-10-17 NOTE — ED Provider Notes (Signed)
[facility]-EMERGENCY DEPT Provider Note   CSN: [id number] Arrival date & time: 10/17/18  0906     History   Chief Complaint Chief Complaint  Patient presents with  . Abdominal Pain    HPI Hannah Burch is a 32 y.o. female.  Patient with history of tubal ligation presents the emergency department complaint of acute onset the right lower quadrant abdominal pain starting yesterday.  Patient has been treating herself at home with milk of magnesia because she thought that if she was constipated.  Her last BM was yesterday but was small.  Pain is worse with movement and palpation.  She has been having trouble walking and due to pain.  She denies any vomiting or fevers.  No urinary symptoms.  She denies vaginal bleeding or discharge.  Last menstrual period was about 2 to 3 weeks ago, normal for patient.  No previous abdominal surgeries otherwise.  Onset of symptoms acute.  Pain is actually improved at time of exam.     Past Medical History:  Diagnosis Date  . Preterm delivery   . SVD (spontaneous vaginal delivery) 07/30/2017    Patient Active Problem List   Diagnosis Date Noted  . SVD (spontaneous vaginal delivery) 07/30/2017  . Normal pregnancy in multigravida in third trimester 07/29/2017  . Pregnancy 07/29/2017  . Rt flank pain 06/19/2017  . Abnormal urine odor 06/19/2017  . Laceration of periurethral tissue with delivery 07/02/2015  . Group beta Strep positive 07/02/2015  . Decreased fetal movement 06/28/2015  . History of preterm delivery (IOL at 36.1 wks due to oligo) 06/28/2015  . Irregular contractions 06/28/2015    Past Surgical History:  Procedure Laterality Date  . TUBAL LIGATION Bilateral 07/30/2017   Procedure: POST PARTUM TUBAL LIGATION;  Surgeon: Sherian Rein, MD;  Location: [facility];  Service: Gynecology;  Laterality: Bilateral;     OB History    Gravida  6   Para  4   Term  3   Preterm  1   AB  2   Living  4     SAB  1   TAB  1   Ectopic  0   Multiple  0   Live Births  4            Home Medications    Prior to Admission medications   Medication Sig Start Date End Date Taking? Authorizing Provider  naproxen sodium (ALEVE) 220 MG tablet Take 440 mg by mouth 2 (two) times daily as needed (cramping).   Yes [provider]  Prenatal Vit-Fe Fumarate-FA (PRENATAL MULTIVITAMIN) TABS tablet Take 1 tablet by mouth daily at 12 noon. 08/01/17  Yes Bovard-Stuckert, Jody, MD  cephALEXin (KEFLEX) 500 MG capsule Take 1 capsule (500 mg total) by mouth 2 (two) times daily. Patient not taking: Reported on 10/17/2018 08/25/17   Wallis Bamberg, PA-C  ibuprofen (ADVIL,MOTRIN) 600 MG tablet Take 1 tablet (600 mg total) by mouth every 6 (six) hours. Patient not taking: Reported on 10/17/2018 08/04/17   Judeth Horn, NP    Family History Family History  Problem Relation Age of Onset  . Hypertension Mother   . Diabetes Father     Social History Social History   Tobacco Use  . Smoking status: Former Games developer  . Smokeless tobacco: Never Used  Substance Use Topics  . Alcohol use: No  . Drug use: No     Allergies   Patient has no known allergies.   Review of Systems  Review of Systems  Constitutional: Negative for fever.  HENT: Negative for rhinorrhea and sore throat.   Eyes: Negative for redness.  Respiratory: Negative for cough.   Cardiovascular: Negative for chest pain.  Gastrointestinal: Positive for abdominal pain. Negative for diarrhea, nausea and vomiting.  Genitourinary: Negative for dysuria, vaginal bleeding and vaginal discharge.  Musculoskeletal: Negative for myalgias.  Skin: Negative for rash.  Neurological: Negative for headaches.     Physical Exam Updated Vital Signs BP 102/68   Pulse 82   Temp 98.6 F (37 C) (Oral)   Resp 16   Ht 5' (1.524 m)   Wt 50.8 kg   LMP 09/30/2018   SpO2 100%   BMI 21.87 kg/m   Physical Exam Vitals signs and  nursing note reviewed.  Constitutional:      Appearance: She is well-developed.  HENT:     Head: Normocephalic and atraumatic.  Eyes:     General:        Right eye: No discharge.        Left eye: No discharge.     Conjunctiva/sclera: Conjunctivae normal.  Neck:     Musculoskeletal: Normal range of motion and neck supple.  Cardiovascular:     Rate and Rhythm: Normal rate and regular rhythm.     Heart sounds: Normal heart sounds.  Pulmonary:     Effort: Pulmonary effort is normal.     Breath sounds: Normal breath sounds.  Abdominal:     Palpations: Abdomen is soft.     Tenderness: There is abdominal tenderness (Focal mild to moderate abdominal tenderness in the right lower quadrant and suprapubic areas.) in the right lower quadrant and suprapubic area. There is no guarding or rebound. Negative signs include Murphy's sign, Rovsing's sign and McBurney's sign.  Skin:    General: Skin is warm and dry.  Neurological:     Mental Status: She is alert.      ED Treatments / Results  Labs (all labs ordered are listed, but only abnormal results are displayed) Labs Reviewed  COMPREHENSIVE METABOLIC PANEL - Abnormal; Notable for the following components:      Result Value   Calcium 8.7 (*)    All other components within normal limits  URINALYSIS, ROUTINE W REFLEX MICROSCOPIC - Abnormal; Notable for the following components:   Color, Urine STRAW (*)    Specific Gravity, Urine >1.046 (*)    All other components within normal limits  LIPASE, BLOOD  CBC  I-STAT BETA HCG BLOOD, ED ([facility], [facility], [facility] ONLY)    EKG None  Radiology US Transvaginal Non-ob  Result Date: 10/17/2018 CLINICAL DATA:  Right-sided pelvic pain since this morning. Right adnexal cyst seen on CT. EXAM: TRANSABDOMINAL AND TRANSVAGINAL ULTRASOUND OF PELVIS DOPPLER ULTRASOUND OF OVARIES TECHNIQUE: Both transabdominal and transvaginal ultrasound examinations of the pelvis were performed. Transabdominal technique was performed  for global imaging of the pelvis including uterus, ovaries, adnexal regions, and pelvic cul-de-sac. It was necessary to proceed with endovaginal exam following the transabdominal exam to visualize the uterus, endometrium and ovaries. Color and duplex Doppler ultrasound was utilized to evaluate blood flow to the ovaries. COMPARISON:  CT from earlier on the same day FINDINGS: Uterus Measurements: 9.8 x 5.4 x 6.7 cm = volume: 185 mL. No fibroids or other mass visualized. Endometrium Thickness: 9.5 mm.  No focal abnormality visualized. Right ovary Measurements: 6.1 x 3.4 x 4.5 cm = volume: 41.8 mL. There is a hypoechoic complex right ovarian cyst with fluid-debris level measuring 4.2  x 2.9 x 3.8 cm. Findings have the appearance of a ruptured or hemorrhagic cyst. Left ovary Measurements: 3 x 1.9 x 2.5 cm = volume: 7.4 mL. Normal appearance/no adnexal mass. Pulsed Doppler evaluation of both ovaries demonstrates normal low-resistance arterial and venous waveforms. Other findings Trace to small free fluid in the pelvis. Nabothian cyst is seen at the cervix. IMPRESSION: Complex right ovarian cyst measuring 4.2 x 2.9 x 3.8 cm. Findings are indeterminate but with benign appearing characteristics more likely to represent a hemorrhagic cyst. Follow-up in 6-12 weeks is recommended to assure resolution. Electronically Signed   By: Tollie Eth M.D.   On: 10/17/2018 13:57   US Pelvis Complete  Result Date: 10/17/2018 CLINICAL DATA:  Right-sided pelvic pain since this morning. Right adnexal cyst seen on CT. EXAM: TRANSABDOMINAL AND TRANSVAGINAL ULTRASOUND OF PELVIS DOPPLER ULTRASOUND OF OVARIES TECHNIQUE: Both transabdominal and transvaginal ultrasound examinations of the pelvis were performed. Transabdominal technique was performed for global imaging of the pelvis including uterus, ovaries, adnexal regions, and pelvic cul-de-sac. It was necessary to proceed with endovaginal exam following the transabdominal exam to visualize  the uterus, endometrium and ovaries. Color and duplex Doppler ultrasound was utilized to evaluate blood flow to the ovaries. COMPARISON:  CT from earlier on the same day FINDINGS: Uterus Measurements: 9.8 x 5.4 x 6.7 cm = volume: 185 mL. No fibroids or other mass visualized. Endometrium Thickness: 9.5 mm.  No focal abnormality visualized. Right ovary Measurements: 6.1 x 3.4 x 4.5 cm = volume: 41.8 mL. There is a hypoechoic complex right ovarian cyst with fluid-debris level measuring 4.2 x 2.9 x 3.8 cm. Findings have the appearance of a ruptured or hemorrhagic cyst. Left ovary Measurements: 3 x 1.9 x 2.5 cm = volume: 7.4 mL. Normal appearance/no adnexal mass. Pulsed Doppler evaluation of both ovaries demonstrates normal low-resistance arterial and venous waveforms. Other findings Trace to small free fluid in the pelvis. Nabothian cyst is seen at the cervix. IMPRESSION: Complex right ovarian cyst measuring 4.2 x 2.9 x 3.8 cm. Findings are indeterminate but with benign appearing characteristics more likely to represent a hemorrhagic cyst. Follow-up in 6-12 weeks is recommended to assure resolution. Electronically Signed   By: Tollie Eth M.D.   On: 10/17/2018 13:57   Ct Abdomen Pelvis W Contrast  Result Date: 10/17/2018 CLINICAL DATA:  Acute right lower quadrant abdominal pain. EXAM: CT ABDOMEN AND PELVIS WITH CONTRAST TECHNIQUE: Multidetector CT imaging of the abdomen and pelvis was performed using the standard protocol following bolus administration of intravenous contrast. CONTRAST:  ISOVUE-300 IOPAMIDOL (ISOVUE-300) INJECTION 61% COMPARISON:  None. FINDINGS: Lower chest: No acute abnormality. Hepatobiliary: No focal liver abnormality is seen. No gallstones, gallbladder wall thickening, or biliary dilatation. Pancreas: Unremarkable. No pancreatic ductal dilatation or surrounding inflammatory changes. Spleen: Normal in size without focal abnormality. Adrenals/Urinary Tract: Adrenal glands are unremarkable.  Kidneys are normal, without renal calculi, focal lesion, or hydronephrosis. Bladder is unremarkable. Stomach/Bowel: The stomach appears normal. There is no evidence of bowel obstruction or inflammation. The appendix is not definitively visualized. Vascular/Lymphatic: No significant vascular findings are present. No enlarged abdominal or pelvic lymph nodes. Reproductive: 5.2 cm complex cystic abnormality is noted in right adnexal region. Uterus and left adnexal region are unremarkable. Other: Small amount of free fluid is noted in the pelvis. Musculoskeletal: No acute or significant osseous findings. IMPRESSION: 5.2 cm complex cystic abnormality seen in right adnexal region. Pelvic ultrasound is recommended for further evaluation. Electronically Signed   By: Roque Lias  Montez HagemanJr, M.D.   On: 10/17/2018 11:59   [place] Art/ven Flow Abd Pelv Doppler  Result Date: 10/17/2018 CLINICAL DATA:  Right-sided pelvic pain since this morning. Right adnexal cyst seen on CT. EXAM: TRANSABDOMINAL AND TRANSVAGINAL ULTRASOUND OF PELVIS DOPPLER ULTRASOUND OF OVARIES TECHNIQUE: Both transabdominal and transvaginal ultrasound examinations of the pelvis were performed. Transabdominal technique was performed for global imaging of the pelvis including uterus, ovaries, adnexal regions, and pelvic cul-de-sac. It was necessary to proceed with endovaginal exam following the transabdominal exam to visualize the uterus, endometrium and ovaries. Color and duplex Doppler ultrasound was utilized to evaluate blood flow to the ovaries. COMPARISON:  CT from earlier on the same day FINDINGS: Uterus Measurements: 9.8 x 5.4 x 6.7 cm = volume: 185 mL. No fibroids or other mass visualized. Endometrium Thickness: 9.5 mm.  No focal abnormality visualized. Right ovary Measurements: 6.1 x 3.4 x 4.5 cm = volume: 41.8 mL. There is a hypoechoic complex right ovarian cyst with fluid-debris level measuring 4.2 x 2.9 x 3.8 cm. Findings have the appearance of a ruptured or  hemorrhagic cyst. Left ovary Measurements: 3 x 1.9 x 2.5 cm = volume: 7.4 mL. Normal appearance/no adnexal mass. Pulsed Doppler evaluation of both ovaries demonstrates normal low-resistance arterial and venous waveforms. Other findings Trace to small free fluid in the pelvis. Nabothian cyst is seen at the cervix. IMPRESSION: Complex right ovarian cyst measuring 4.2 x 2.9 x 3.8 cm. Findings are indeterminate but with benign appearing characteristics more likely to represent a hemorrhagic cyst. Follow-up in 6-12 weeks is recommended to assure resolution. Electronically Signed   By: Tollie Ethavid  Kwon M.D.   On: 10/17/2018 13:57    Procedures Procedures (including critical care time)  Medications Ordered in ED Medications  iopamidol (ISOVUE-300) 61 % injection (has no administration in time range)  sodium chloride (PF) 0.9 % injection (has no administration in time range)  iopamidol (ISOVUE-300) 61 % injection 100 mL (100 mLs Intravenous Contrast Given 10/17/18 1136)     Initial Impression / Assessment and Plan / ED Course  I have reviewed the triage vital signs and the nursing notes.  Pertinent labs & imaging results that were available during my care of the patient were reviewed by me and considered in my medical decision making (see chart for details).     Patient seen and examined. Work-up initiated.  Patient declines pain medication at this time.  Discussed need for further imaging given her focal right lower quadrant tenderness to rule out appendicitis.  We discussed other potential differentials including ovarian etiology.  Based on her history, I feel that ovarian torsion is less likely at this point.  Vital signs reviewed and are as follows: BP 102/68   Pulse 82   Temp 98.6 F (37 C) (Oral)   Resp 16   Ht 5' (1.524 m)   Wt 50.8 kg   LMP 09/30/2018   SpO2 100%   BMI 21.87 kg/m   12:17 PM CT reviewed.  Patient with question 5 cm right ovarian cyst.  Ultrasound ordered to further  delineate and to rule out ovarian torsion.  Patient updated on results.  She remains comfortable.  Agrees with plan.  2:37 PM ultrasound reviewed.  Patient with likely right-sided hemorrhagic cyst, possible ruptured cyst.  This is consistent with patient symptoms.  Patient informed.  She remains comfortable.  We will discharged home with ibuprofen use for pain as well as rest.  Encouraged OB/GYN follow-up --referral given.  The patient was urged to  return to the Emergency Department immediately with worsening of current symptoms, worsening abdominal pain, persistent vomiting, blood noted in stools, fever, or any other concerns. The patient verbalized understanding.    Final Clinical Impressions(s) / ED Diagnoses   Final diagnoses:  Cyst of right ovary   Patient with 1 day of right lower quadrant pain, initial exam concerning for appendicitis.  Evaluated with CT showing large right ovarian cyst.  Ultrasound does not show any signs of torsion.  Lab work-up is otherwise reassuring.  Symptoms controlled.  Patient stable for discharged home with conservative measures and OBGYN follow-up.   ED Discharge Orders         Ordered    ibuprofen (ADVIL,MOTRIN) 600 MG tablet  Every 6 hours PRN     10/17/18 1434           Renne Crigler, PA-C 10/17/18 1439    Alvira Monday, MD 10/18/18 1116

## 2018-10-17 NOTE — Discharge Instructions (Signed)
Please read and follow all provided instructions.  Your diagnoses today include:  1. Cyst of right ovary     Tests performed today include:  Blood counts and electrolytes  Blood tests to check liver and kidney function  Blood tests to check pancreas function  Urine test to look for infection and pregnancy (in women)  CT scan - shows ovarian cyst, no appendicitis  Ultrasound - shows ovarian cyst without signs of twisting  Vital signs. See below for your results today.   Medications prescribed:   Ibuprofen (Motrin, Advil) - anti-inflammatory pain medication  Do not exceed 600mg  ibuprofen every 6 hours, take with food  You have been prescribed an anti-inflammatory medication or NSAID. Take with food. Take smallest effective dose for the shortest duration needed for your pain. Stop taking if you experience stomach pain or vomiting.   Take any prescribed medications only as directed.  Home care instructions:   Follow any educational materials contained in this packet.  Follow-up instructions: Please follow-up with your primary care provider or the OB/GYN referral listed for further evaluation and treatment.    Return instructions:  SEEK IMMEDIATE MEDICAL ATTENTION IF:  The pain does not go away or becomes severe   A temperature above 101F develops   Repeated vomiting occurs (multiple episodes)   The pain becomes localized to portions of the abdomen. The right side could possibly be appendicitis. In an adult, the left lower portion of the abdomen could be colitis or diverticulitis.   Blood is being passed in stools or vomit (bright red or black tarry stools)   You develop chest pain, difficulty breathing, dizziness or fainting, or become confused, poorly responsive, or inconsolable (young children)  If you have any other emergent concerns regarding your health  Additional Information: Abdominal (belly) pain can be caused by many things. Your caregiver performed an  examination and possibly ordered blood/urine tests and imaging (CT scan, x-rays, ultrasound). Many cases can be observed and treated at home after initial evaluation in the emergency department. Even though you are being discharged home, abdominal pain can be unpredictable. Therefore, you need a repeated exam if your pain does not resolve, returns, or worsens. Most patients with abdominal pain don't have to be admitted to the hospital or have surgery, but serious problems like appendicitis and gallbladder attacks can start out as nonspecific pain. Many abdominal conditions cannot be diagnosed in one visit, so follow-up evaluations are very important.  Your vital signs today were: BP 112/78    Pulse 66    Temp 98.6 F (37 C) (Oral)    Resp 16    Ht 5' (1.524 m)    Wt 50.8 kg    LMP 09/30/2018    SpO2 100%    BMI 21.87 kg/m  If your blood pressure (bp) was elevated above 135/85 this visit, please have this repeated by your doctor within one month. --------------

## 2018-10-17 NOTE — ED Triage Notes (Signed)
Pt states she has been having RLQ pain since yesterday. Pt states she was concerned she was constipated, but took milk of magnesia without relief. Pt states she hasn't been able to have a BM. Pt states that it is painful to walk due to the abd pain.

## 2018-11-27 ENCOUNTER — Emergency Department (HOSPITAL_COMMUNITY)
Admission: EM | Admit: 2018-11-27 | Discharge: 2018-11-27 | Disposition: A | Discharge status: Home / Self Care | Payer: Medicaid Other | Attending: Emergency Medicine | Admitting: Emergency Medicine

## 2018-11-27 ENCOUNTER — Emergency Department (HOSPITAL_COMMUNITY): Payer: Medicaid Other

## 2018-11-27 ENCOUNTER — Other Ambulatory Visit: Payer: Self-pay

## 2018-11-27 ENCOUNTER — Encounter (HOSPITAL_COMMUNITY): Payer: Self-pay

## 2018-11-27 DIAGNOSIS — R0789 Other chest pain: Secondary | ICD-10-CM | POA: Insufficient documentation

## 2018-11-27 DIAGNOSIS — Z79899 Other long term (current) drug therapy: Secondary | ICD-10-CM | POA: Diagnosis not present

## 2018-11-27 DIAGNOSIS — Z87891 Personal history of nicotine dependence: Secondary | ICD-10-CM | POA: Insufficient documentation

## 2018-11-27 DIAGNOSIS — R079 Chest pain, unspecified: Secondary | ICD-10-CM | POA: Diagnosis present

## 2018-11-27 LAB — TROPONIN I: Troponin I: <0.03 ng/mL (ref <0.03)

## 2018-11-27 LAB — CBC
HCT: 38.8 % (ref 36.0–46.0)
Hemoglobin: 12.2 g/dL (ref 12.0–15.0)
MCH: 29.8 pg (ref 26.0–34.0)
MCHC: 31.4 g/dL (ref 30.0–36.0)
MCV: 94.9 fL (ref 80.0–100.0)
Platelets: 233 10*3/uL (ref 150–400)
RBC: 4.09 MIL/uL (ref 3.87–5.11)
RDW: 12.9 % (ref 11.5–15.5)
WBC: 5.1 10*3/uL (ref 4.0–10.5)
nRBC: 0 % (ref 0.0–0.2)

## 2018-11-27 LAB — I-STAT BETA HCG BLOOD, ED (MC, WL, AP ONLY): I-stat hCG, quantitative: <5 m[IU]/mL (ref <5)

## 2018-11-27 LAB — BASIC METABOLIC PANEL
Anion gap: 6 (ref 5–15)
BUN: 13 mg/dL (ref 6–20)
CO2: 26 mmol/L (ref 22–32)
Calcium: 8.5 mg/dL — ABNORMAL LOW (ref 8.9–10.3)
Chloride: 105 mmol/L (ref 98–111)
Creatinine, Ser: 0.67 mg/dL (ref 0.44–1.00)
GFR calc Af Amer: >60 mL/min (ref >60)
GFR calc non Af Amer: >60 mL/min (ref >60)
Glucose, Bld: 75 mg/dL (ref 70–99)
Potassium: 3.4 mmol/L — ABNORMAL LOW (ref 3.5–5.1)
Sodium: 137 mmol/L (ref 135–145)

## 2018-11-27 MED ORDER — SODIUM CHLORIDE 0.9% FLUSH
3.0000 mL | Freq: Once | INTRAVENOUS | Status: DC
  Administered 2018-11-27: 3 mL via INTRAVENOUS

## 2018-11-27 NOTE — ED Provider Notes (Signed)
[facility]-EMERGENCY DEPT Provider Note   CSN: [id number] Arrival date & time: 11/27/18  0831     History   Chief Complaint Chief Complaint  Patient presents with  . Chest Pain    HPI Hannah Burch is a 33 y.o. female.  HPI Presents with chest pain. Onset was about 3 or 4 days ago. Since that time she has had episodes occurring spontaneously, lasting several moments. Each episode is a sense of mild discomfort, left parasternal, with halting respiration, some sense of missed heartbeats, but no radiating pain, no syncope. She notes that she is otherwise generally well, denies chronic medical problems, takes no medication regularly. No recent substantial life changes, patient does not smoke, does not drink. Past Medical History:  Diagnosis Date  . Preterm delivery   . SVD (spontaneous vaginal delivery) 07/30/2017    Patient Active Problem List   Diagnosis Date Noted  . SVD (spontaneous vaginal delivery) 07/30/2017  . Normal pregnancy in multigravida in third trimester 07/29/2017  . Pregnancy 07/29/2017  . Rt flank pain 06/19/2017  . Abnormal urine odor 06/19/2017  . Laceration of periurethral tissue with delivery 07/02/2015  . Group beta Strep positive 07/02/2015  . Decreased fetal movement 06/28/2015  . History of preterm delivery (IOL at 36.1 wks due to oligo) 06/28/2015  . Irregular contractions 06/28/2015    Past Surgical History:  Procedure Laterality Date  . TUBAL LIGATION Bilateral 07/30/2017   Procedure: POST PARTUM TUBAL LIGATION;  Surgeon: Sherian Rein, MD;  Location: [facility];  Service: Gynecology;  Laterality: Bilateral;     OB History    Gravida  6   Para  4   Term  3   Preterm  1   AB  2   Living  4     SAB  1   TAB  1   Ectopic  0   Multiple  0   Live Births  4            Home Medications    Prior to Admission medications   Medication Sig Start Date End Date Taking?  Authorizing Provider  ibuprofen (ADVIL,MOTRIN) 600 MG tablet Take 1 tablet (600 mg total) by mouth every 6 (six) hours as needed. Patient taking differently: Take 600 mg by mouth every 6 (six) hours as needed for moderate pain.  10/17/18  Yes Renne Crigler, PA-C  Prenatal Vit-Fe Fumarate-FA (PRENATAL MULTIVITAMIN) TABS tablet Take 1 tablet by mouth daily at 12 noon. Patient not taking: Reported on 11/27/2018 08/01/17   Sherian Rein, MD    Family History Family History  Problem Relation Age of Onset  . Hypertension Mother   . Diabetes Father     Social History Social History   Tobacco Use  . Smoking status: Former Games developer  . Smokeless tobacco: Never Used  Substance Use Topics  . Alcohol use: No  . Drug use: No     Allergies   Patient has no known allergies.   Review of Systems Review of Systems  Constitutional:       Per HPI, otherwise negative  HENT:       Per HPI, otherwise negative  Respiratory:       Per HPI, otherwise negative  Cardiovascular:       Per HPI, otherwise negative  Gastrointestinal: Negative for vomiting.  Endocrine:       Negative aside from HPI  Genitourinary:       Neg aside from HPI   Musculoskeletal:  Per HPI, otherwise negative  Skin: Negative.   Neurological: Negative for syncope.     Physical Exam Updated Vital Signs BP 102/70   Pulse 72   Temp 98.4 F (36.9 C) (Oral)   Resp 16   Ht 5' (1.524 m)   Wt 52.2 kg   LMP 11/20/2018 (Approximate)   SpO2 100%   BMI 22.46 kg/m   Physical Exam Vitals signs and nursing note reviewed.  Constitutional:      General: She is not in acute distress.    Appearance: She is well-developed.  HENT:     Head: Normocephalic and atraumatic.  Eyes:     Conjunctiva/sclera: Conjunctivae normal.  Cardiovascular:     Rate and Rhythm: Normal rate and regular rhythm.  Pulmonary:     Effort: Pulmonary effort is normal. No respiratory distress.     Breath sounds: Normal breath sounds.  No stridor.  Chest:     Comments: No tenderness to palpation, no deformity, no crepitus Abdominal:     General: There is no distension.  Skin:    General: Skin is warm and dry.  Neurological:     Mental Status: She is alert and oriented to person, place, and time.     Cranial Nerves: No cranial nerve deficit.      ED Treatments / Results  Labs (all labs ordered are listed, but only abnormal results are displayed) Labs Reviewed  BASIC METABOLIC PANEL - Abnormal; Notable for the following components:      Result Value   Potassium 3.4 (*)    Calcium 8.5 (*)    All other components within normal limits  CBC  TROPONIN I  I-STAT BETA HCG BLOOD, ED ([facility], [facility], [facility] ONLY)    EKG EKG Interpretation  Date/Time:  Tuesday November 27 2018 08:41:45 EST Ventricular Rate:  69 PR Interval:    QRS Duration: 92 QT Interval:  374 QTC Calculation: 401 R Axis:   66 Text Interpretation:  Sinus rhythm Borderline repolarization abnormality noon-specific minimal st depression in III Abnormal ekg Confirmed by Gerhard MunchLockwood, Kaylor Maiers [phone]([id number]) on 11/27/2018 8:53:02 AM Also confirmed by Gerhard MunchLockwood, Hoy Fallert ([id number]), editor Barbette Hairassel, Kerry [phone]([id number])  on 11/27/2018 10:26:54 AM   Radiology Dg Chest 2 View  Result Date: 11/27/2018 CLINICAL DATA:  Chest pain. EXAM: CHEST - 2 VIEW COMPARISON:  None. FINDINGS: The heart size and mediastinal contours are within normal limits. Both lungs are clear. The visualized skeletal structures are unremarkable. IMPRESSION: Normal exam. Electronically Signed   By: Francene BoyersJames  Maxwell M.D.   On: 11/27/2018 09:26    Procedures Procedures (including critical care time)  Medications Ordered in ED Medications - No data to display   Initial Impression / Assessment and Plan / ED Course  I have reviewed the triage vital signs and the nursing notes.  Pertinent labs & imaging results that were available during my care of the patient were reviewed by me and considered in my medical decision making  (see chart for details).     10:50 AM Patient in no distress, awake, alert. During her.  Here she has had continuous cardiac monitoring, and now, on repeat exam she states that at 950 she had a similar episode of pain in her left parasternal upper chest. I reviewed the patient's cardiac monitoring strips, and there was a brief period of tachycardia during that timeframe, but no evidence for ischemic changes. We discussed reassuring results, and with low risk profile for ACS, no distress, no evidence for ACS, the patient is  appropriate for discharge with close outpatient follow-up.  Final Clinical Impressions(s) / ED Diagnoses  Atypical chest pain   Gerhard MunchLockwood, Deem Marmol, MD 11/27/18 1051

## 2018-11-27 NOTE — Discharge Instructions (Signed)
As discussed, your evaluation today has been largely reassuring.  But, it is important that you monitor your condition carefully, and do not hesitate to return to the ED if you develop new, or concerning changes in your condition. ? ?Otherwise, please follow-up with your physician for appropriate ongoing care. ? ?

## 2018-11-27 NOTE — ED Triage Notes (Signed)
Patient c/o intermittent left chest pain x 3 days. Patient states "it feels like my heart stops" patient states the pain is usually with activity,but last night she had an episode when she was laying flat.

## 2019-09-05 ENCOUNTER — Other Ambulatory Visit: Payer: Self-pay

## 2019-09-05 DIAGNOSIS — Z20822 Contact with and (suspected) exposure to covid-19: Secondary | ICD-10-CM

## 2019-09-07 LAB — NOVEL CORONAVIRUS, NAA: SARS-CoV-2, NAA: NOT DETECTED

## 2019-12-30 ENCOUNTER — Ambulatory Visit: Payer: Medicaid Other | Attending: Internal Medicine

## 2020-02-07 ENCOUNTER — Ambulatory Visit: Payer: Medicaid Other | Attending: Internal Medicine

## 2020-02-07 DIAGNOSIS — Z23 Encounter for immunization: Secondary | ICD-10-CM

## 2020-02-07 NOTE — Progress Notes (Signed)
   Covid-19 Vaccination Clinic  Name:  Hannah Burch    MRN: [id number] DOB: [birth year]  02/07/2020  [name] was observed post Covid-19 immunization for 15 minutes without incident. She was provided with Vaccine Information Sheet and instruction to access the V-Safe system.   [name] was instructed to call 911 with any severe reactions post vaccine: Marland Kitchen Difficulty breathing  . Swelling of face and throat  . A fast heartbeat  . A bad rash all over body  . Dizziness and weakness   Immunizations Administered    Name Date Dose VIS Date Route   Pfizer COVID-19 Vaccine 02/07/2020  1:32 PM 0.3 mL 10/11/2019 Intramuscular   Manufacturer: [employer], [employer]   Lot: [id number]   NDC: [address]-5

## 2020-03-02 ENCOUNTER — Ambulatory Visit: Payer: Medicaid Other | Attending: Internal Medicine

## 2020-03-02 DIAGNOSIS — Z23 Encounter for immunization: Secondary | ICD-10-CM

## 2020-03-02 NOTE — Progress Notes (Signed)
   Covid-19 Vaccination Clinic  Name:  Jaley Yan    MRN: [id number] DOB: 02-Dec-1985  03/02/2020  Ms. Burruel was observed post Covid-19 immunization for 15 minutes without incident. She was provided with Vaccine Information Sheet and instruction to access the V-Safe system.   Ms. Montas was instructed to call 911 with any severe reactions post vaccine: Marland Kitchen Difficulty breathing  . Swelling of face and throat  . A fast heartbeat  . A bad rash all over body  . Dizziness and weakness   Immunizations Administered    Name Date Dose VIS Date Route   Pfizer COVID-19 Vaccine 03/02/2020  2:41 PM 0.3 mL 12/25/2018 Intramuscular   Manufacturer: [employer], [employer]   Lot: [id number]   NDC: [address]-6

## 2021-12-05 ENCOUNTER — Ambulatory Visit (HOSPITAL_COMMUNITY)
Admission: EM | Admit: 2021-12-05 | Discharge: 2021-12-05 | Disposition: A | Discharge status: Home / Self Care | Payer: Medicaid Other | Attending: Emergency Medicine | Admitting: Emergency Medicine

## 2021-12-05 ENCOUNTER — Encounter (HOSPITAL_COMMUNITY): Payer: Self-pay | Admitting: Emergency Medicine

## 2021-12-05 ENCOUNTER — Other Ambulatory Visit: Payer: Self-pay

## 2021-12-05 DIAGNOSIS — J101 Influenza due to other identified influenza virus with other respiratory manifestations: Secondary | ICD-10-CM

## 2021-12-05 LAB — POC INFLUENZA A AND B ANTIGEN (URGENT CARE ONLY)
INFLUENZA A ANTIGEN, POC: POSITIVE — AB
INFLUENZA B ANTIGEN, POC: NEGATIVE

## 2021-12-05 MED ORDER — ACETAMINOPHEN 325 MG PO TABS
ORAL_TABLET | ORAL | Status: AC
  Filled 2021-12-05: qty 3

## 2021-12-05 MED ORDER — IBUPROFEN 400 MG PO TABS: 400.0000 mg | ORAL_TABLET | Freq: Three times a day (TID) | ORAL | 0 refills | Status: DC | PRN

## 2021-12-05 MED ORDER — PROMETHAZINE-DM 6.25-15 MG/5ML PO SYRP: 5.0000 mL | ORAL_SOLUTION | Freq: Four times a day (QID) | ORAL | 0 refills | Status: DC | PRN

## 2021-12-05 MED ORDER — ACETAMINOPHEN 325 MG PO TABS: 975.0000 mg | ORAL_TABLET | Freq: Once | ORAL | Status: DC

## 2021-12-05 MED ORDER — ACETAMINOPHEN 325 MG PO TABS
975.0000 mg | ORAL_TABLET | Freq: Once | ORAL | Status: AC
  Administered 2021-12-05: 975 mg via ORAL

## 2021-12-05 MED ORDER — OSELTAMIVIR PHOSPHATE 75 MG PO CAPS: 75.0000 mg | ORAL_CAPSULE | Freq: Two times a day (BID) | ORAL | 0 refills | Status: DC

## 2021-12-05 MED ORDER — GUAIFENESIN 400 MG PO TABS: ORAL_TABLET | ORAL | 0 refills | Status: DC

## 2021-12-05 NOTE — ED Triage Notes (Signed)
Since last night having sore throat, cough, headache.

## 2021-12-05 NOTE — Discharge Instructions (Addendum)
Your influenza test today was positive.  Please read the enclosed information about influenza and care at home.  I provided you with a prescription for Tamiflu, please take 1 capsule twice daily for the next 5 days.  Tamiflu will reduce the viruses ability to multiply your body, making less contagious and preventing you from becoming severely ill.  Please see the list below for recommended medications, dosages and frequencies to provide relief of your current symptoms:     Ibuprofen  (Advil, Motrin): This is a good anti-inflammatory medication which addresses aches, pains and inflammation of the upper airways that causes sinus and nasal congestion as well as in the lower airways which makes your cough feel tight and sometimes burn.  I recommend that you take between 400 to 600 mg every 6-8 hours as needed, I have provided you with a prescription for 400 mg.      Acetaminophen (Tylenol): This is a good fever reducer.  If your body temperature rises above 101.5 as measured with a thermometer, it is recommended that you take 1,000 mg every 8 hours until your temperature falls below 101.5, please not take more than 3,000 mg of acetaminophen either as a separate medication or as in ingredient in an over-the-counter cold/flu preparation within a 24-hour period.      Guaifenesin (Robitussin, Mucinex): This is an expectorant.  This helps break up chest congestion and loosen up thick nasal drainage making phlegm and drainage more liquid and therefore easier to remove.  I recommend being 400 mg three times daily as needed.      Promethazine DM: Promethazine is both a nasal decongestant and an antinausea medication that makes most patients feel fairly sleepy.  The DM is dextromethorphan, a cough suppressant found in many over-the-counter cough medications.  Please take 5 mL before bedtime to minimize your cough which will help you sleep better.  I have provided you with a prescription for this medication.       Conservative care is also recommended at this time.  This includes rest, pushing clear fluids and activity as tolerated.  Warm beverages such as teas and broths versus cold beverages/popsicles and frozen sherbet/sorbet are your choice, both warm and cold are beneficial.  You may also notice that your appetite is reduced; this is okay as long as you are drinking plenty of clear fluids.    Please remain home from work, school, public places until you have been fever free for 24 hours without the use of antifever medications such as Tylenol or ibuprofen.    Please follow-up within the next 3 to 5 days either with your primary care provider or urgent care if your symptoms do not resolve.  If you do not have a primary care provider, we will assist you in finding one.

## 2021-12-05 NOTE — ED Provider Notes (Signed)
[facility]    CSN: [id number] Arrival date & time: 12/05/21  1429    HISTORY   Chief Complaint  Patient presents with   Cough   Headache   Sore Throat   HPI Hannah Burch is a 36 y.o. female. Patient complains of sore throat, cough and headache that began last night.  Patient has a temp of 102.3 on arrival with a pulse of 124.  Rapid flu test today was positive.  Patient states she is not trying medications for her symptoms as of yet.  Patient denies known sick contacts, states she works from home.  The history is provided by the patient.  Past Medical History:  Diagnosis Date   Preterm delivery    SVD (spontaneous vaginal delivery) 07/30/2017   Patient Active Problem List   Diagnosis Date Noted   SVD (spontaneous vaginal delivery) 07/30/2017   Normal pregnancy in multigravida in third trimester 07/29/2017   Pregnancy 07/29/2017   Rt flank pain 06/19/2017   Abnormal urine odor 06/19/2017   Laceration of periurethral tissue with delivery 07/02/2015   Group beta Strep positive 07/02/2015   Decreased fetal movement 06/28/2015   History of preterm delivery (IOL at 36.1 wks due to oligo) 06/28/2015   Irregular contractions 06/28/2015   Past Surgical History:  Procedure Laterality Date   TUBAL LIGATION Bilateral 07/30/2017   Procedure: POST PARTUM TUBAL LIGATION;  Surgeon: Sherian ReinBovard-Stuckert, Jody, MD;  Location: [facility];  Service: Gynecology;  Laterality: Bilateral;   OB History     Gravida  6   Para  4   Term  3   Preterm  1   AB  2   Living  4      SAB  1   IAB  1   Ectopic  0   Multiple  0   Live Births  4          Home Medications    Prior to Admission medications   Medication Sig Start Date End Date Taking? Authorizing Provider  ibuprofen (ADVIL,MOTRIN) 600 MG tablet Take 1 tablet (600 mg total) by mouth every 6 (six) hours as needed. Patient taking differently: Take 600 mg by mouth every 6 (six) hours as needed  for moderate pain.  10/17/18   Renne CriglerGeiple, Joshua, PA-C   Family History Family History  Problem Relation Age of Onset   Hypertension Mother    Diabetes Father    Social History Social History   Tobacco Use   Smoking status: Former   Smokeless tobacco: Never  Building services engineerVaping Use   Vaping Use: Never used  Substance Use Topics   Alcohol use: No   Drug use: No   Allergies   Patient has no known allergies.  Review of Systems Review of Systems Pertinent findings noted in history of present illness.   Physical Exam Triage Vital Signs ED Triage Vitals  Enc Vitals Group     BP 08/27/21 0827 (!) 147/82     Pulse Rate 08/27/21 0827 72     Resp 08/27/21 0827 18     Temp 08/27/21 0827 98.3 F (36.8 C)     Temp Source 08/27/21 0827 Oral     SpO2 08/27/21 0827 98 %     Weight --      Height --      Head Circumference --      Peak Flow --      Pain Score 08/27/21 0826 5     Pain Loc --  Pain Edu? --      Excl. in GC? --   No data found.  Updated Vital Signs BP 122/78 (BP Location: Right Arm)    Pulse (!) 124    Temp (!) 102.3 F (39.1 C) (Oral)    Resp 18    LMP 12/02/2021    SpO2 98%   Physical Exam Constitutional:      Appearance: She is ill-appearing.  HENT:     Head: Normocephalic and atraumatic.     Salivary Glands: Right salivary gland is not diffusely enlarged or tender. Left salivary gland is not diffusely enlarged or tender.     Right Ear: Tympanic membrane, ear canal and external ear normal.     Left Ear: Tympanic membrane, ear canal and external ear normal.     Nose: Congestion and rhinorrhea present. Rhinorrhea is clear.     Right Sinus: No maxillary sinus tenderness or frontal sinus tenderness.     Left Sinus: No maxillary sinus tenderness.     Mouth/Throat:     Mouth: Mucous membranes are moist.     Pharynx: Pharyngeal swelling, posterior oropharyngeal erythema and uvula swelling present.     Tonsils: No tonsillar exudate. 0 on the right. 0 on the left.   Cardiovascular:     Rate and Rhythm: Normal rate and regular rhythm.     Pulses: Normal pulses.  Pulmonary:     Effort: Pulmonary effort is normal. No accessory muscle usage, prolonged expiration or respiratory distress.     Breath sounds: No stridor. No wheezing, rhonchi or rales.     Comments: Turbulent breath sounds throughout without wheeze, rale, rhonchi. Abdominal:     General: Abdomen is flat. Bowel sounds are normal.     Palpations: Abdomen is soft.  Musculoskeletal:        General: Normal range of motion.  Lymphadenopathy:     Cervical: Cervical adenopathy present.     Right cervical: Superficial cervical adenopathy and posterior cervical adenopathy present.     Left cervical: Superficial cervical adenopathy and posterior cervical adenopathy present.  Skin:    General: Skin is warm and dry.  Neurological:     General: No focal deficit present.     Mental Status: She is alert and oriented to person, place, and time.     Motor: Motor function is intact.     Coordination: Coordination is intact.     Gait: Gait is intact.     Deep Tendon Reflexes: Reflexes are normal and symmetric.  Psychiatric:        Attention and Perception: Attention and perception normal.        Mood and Affect: Mood and affect normal.        Speech: Speech normal.        Behavior: Behavior normal. Behavior is cooperative.        Thought Content: Thought content normal.    Visual Acuity Right Eye Distance:   Left Eye Distance:   Bilateral Distance:    Right Eye Near:   Left Eye Near:    Bilateral Near:     UC Couse / Diagnostics / Procedures:    EKG  Radiology No results found.  Procedures Procedures (including critical care time)  UC Diagnoses / Final Clinical Impressions(s)   I have reviewed the triage vital signs and the nursing notes.  Pertinent labs & imaging results that were available during my care of the patient were reviewed by me and considered in my medical decision  making (see chart for details).   Final diagnoses:  Influenza A   Patient provided with a prescription for Tamiflu, note for work.  Patient provided with Tylenol during the visit today.  Conservative care recommended, supportive medications prescribed.  Return precautions advised.  ED Prescriptions     Medication Sig Dispense Auth. Provider   oseltamivir (TAMIFLU) 75 MG capsule Take 1 capsule (75 mg total) by mouth every 12 (twelve) hours. 10 capsule Theadora RamaMorgan, Desiree Daise Scales, PA-C   ibuprofen (ADVIL) 400 MG tablet Take 1 tablet (400 mg total) by mouth every 8 (eight) hours as needed for up to 30 doses. 30 tablet Theadora RamaMorgan, Rashana Andrew Scales, PA-C   guaifenesin (HUMIBID E) 400 MG TABS tablet Take 1 tablet 3 times daily as needed for chest congestion and cough 21 tablet Theadora RamaMorgan, Clemence Stillings Scales, PA-C   promethazine-dextromethorphan (PROMETHAZINE-DM) 6.25-15 MG/5ML syrup Take 5 mLs by mouth 4 (four) times daily as needed for cough. 180 mL Theadora RamaMorgan, Atharva Mirsky Scales, PA-C      PDMP not reviewed this encounter.  Pending results:  Labs Reviewed  POC INFLUENZA A AND B ANTIGEN (URGENT CARE ONLY) - Abnormal; Notable for the following components:      Result Value   INFLUENZA A ANTIGEN, POC POSITIVE (*)    INFLUENZA B ANTIGEN, POC POSITIVE (*)    All other components within normal limits  POC INFLUENZA A AND B ANTIGEN (URGENT CARE ONLY) - Abnormal; Notable for the following components:   INFLUENZA A ANTIGEN, POC POSITIVE (*)    All other components within normal limits    Medications Ordered in UC: Medications  acetaminophen (TYLENOL) tablet 975 mg (975 mg Oral Given 12/05/21 1552)    Disposition Upon Discharge:  Condition: stable for discharge home Home: take medications as prescribed; routine discharge instructions as discussed; follow up as advised.  Patient presented with an acute illness with associated systemic symptoms and significant discomfort requiring urgent management. In my opinion, this is  a condition that a prudent lay person (someone who possesses an average knowledge of health and medicine) may potentially expect to result in complications if not addressed urgently such as respiratory distress, impairment of bodily function or dysfunction of bodily organs.   Routine symptom specific, illness specific and/or disease specific instructions were discussed with the patient and/or caregiver at length.   As such, the patient has been evaluated and assessed, work-up was performed and treatment was provided in alignment with urgent care protocols and evidence based medicine.  Patient/parent/caregiver has been advised that the patient may require follow up for further testing and treatment if the symptoms continue in spite of treatment, as clinically indicated and appropriate.  If the patient was tested for COVID-19, Influenza and/or RSV, then the patient/parent/guardian was advised to isolate at home pending the results of his/her diagnostic coronavirus test and potentially longer if theyre positive. I have also advised pt that if his/her COVID-19 test returns positive, it's recommended to self-isolate for at least 10 days after symptoms first appeared AND until fever-free for 24 hours without fever reducer AND other symptoms have improved or resolved. Discussed self-isolation recommendations as well as instructions for household member/close contacts as per the [facility] and [place] [facility]HS, and also gave patient the COVID packet with this information.  Patient/parent/caregiver has been advised [facility] in 3-5 days if no better; to PCP or the Emergency Department if new signs and symptoms develop, or if the current signs or symptoms continue to change or worsen  for further workup, evaluation and treatment as clinically indicated and appropriate  The patient will follow up with their current PCP if and as advised. If the patient does not currently have a PCP we will assist them in obtaining  one.   The patient may need specialty follow up if the symptoms continue, in spite of conservative treatment and management, for further workup, evaluation, consultation and treatment as clinically indicated and appropriate.  Patient/parent/caregiver verbalized understanding and agreement of plan as discussed.  All questions were addressed during visit.  Please see discharge instructions below for further details of plan.  Discharge Instructions:   Discharge Instructions      Your influenza test today was positive.  Please read the enclosed information about influenza and care at home.  I provided you with a prescription for Tamiflu, please take 1 capsule twice daily for the next 5 days.  Tamiflu will reduce the viruses ability to multiply your body, making less contagious and preventing you from becoming severely ill.  Please see the list below for recommended medications, dosages and frequencies to provide relief of your current symptoms:     Ibuprofen  (Advil, Motrin): This is a good anti-inflammatory medication which addresses aches, pains and inflammation of the upper airways that causes sinus and nasal congestion as well as in the lower airways which makes your cough feel tight and sometimes burn.  I recommend that you take between 400 to 600 mg every 6-8 hours as needed, I have provided you with a prescription for 400 mg.      Acetaminophen (Tylenol): This is a good fever reducer.  If your body temperature rises above 101.5 as measured with a thermometer, it is recommended that you take 1,000 mg every 8 hours until your temperature falls below 101.5, please not take more than 3,000 mg of acetaminophen either as a separate medication or as in ingredient in an over-the-counter cold/flu preparation within a 24-hour period.      Guaifenesin (Robitussin, Mucinex): This is an expectorant.  This helps break up chest congestion and loosen up thick nasal drainage making phlegm and drainage more  liquid and therefore easier to remove.  I recommend being 400 mg three times daily as needed.      Promethazine DM: Promethazine is both a nasal decongestant and an antinausea medication that makes most patients feel fairly sleepy.  The DM is dextromethorphan, a cough suppressant found in many over-the-counter cough medications.  Please take 5 mL before bedtime to minimize your cough which will help you sleep better.  I have provided you with a prescription for this medication.      Conservative care is also recommended at this time.  This includes rest, pushing clear fluids and activity as tolerated.  Warm beverages such as teas and broths versus cold beverages/popsicles and frozen sherbet/sorbet are your choice, both warm and cold are beneficial.  You may also notice that your appetite is reduced; this is okay as long as you are drinking plenty of clear fluids.    Please remain home from work, school, public places until you have been fever free for 24 hours without the use of antifever medications such as Tylenol or ibuprofen.    Please follow-up within the next 3 to 5 days either with your primary care provider or urgent care if your symptoms do not resolve.  If you do not have a primary care provider, we will assist you in finding one.     This office note  has been dictated using Teaching laboratory technician.  Unfortunately, and despite my best efforts, this method of dictation can sometimes lead to occasional typographical or grammatical errors.  I apologize in advance if this occurs.     Theadora Rama Scales, [place] 12/05/21 [phone]

## 2021-12-06 LAB — POC INFLUENZA A AND B ANTIGEN (URGENT CARE ONLY)
INFLUENZA A ANTIGEN, POC: POSITIVE — AB
INFLUENZA B ANTIGEN, POC: POSITIVE — AB

## 2024-09-30 ENCOUNTER — Ambulatory Visit: Admitting: Family

## 2024-09-30 ENCOUNTER — Ambulatory Visit: Payer: Self-pay | Admitting: Family

## 2024-09-30 ENCOUNTER — Other Ambulatory Visit (HOSPITAL_COMMUNITY)
Admission: RE | Admit: 2024-09-30 | Discharge: 2024-09-30 | Disposition: A | Source: Ambulatory Visit | Attending: Family | Admitting: Family

## 2024-09-30 VITALS — BP 111/78 | HR 70 | Temp 98.3°F | Resp 16 | Ht 62.0 in | Wt 118.8 lb

## 2024-09-30 DIAGNOSIS — N3001 Acute cystitis with hematuria: Secondary | ICD-10-CM

## 2024-09-30 DIAGNOSIS — R002 Palpitations: Secondary | ICD-10-CM | POA: Diagnosis not present

## 2024-09-30 DIAGNOSIS — N3000 Acute cystitis without hematuria: Secondary | ICD-10-CM

## 2024-09-30 DIAGNOSIS — R399 Unspecified symptoms and signs involving the genitourinary system: Secondary | ICD-10-CM

## 2024-09-30 DIAGNOSIS — Z7689 Persons encountering health services in other specified circumstances: Secondary | ICD-10-CM

## 2024-09-30 DIAGNOSIS — Z113 Encounter for screening for infections with a predominantly sexual mode of transmission: Secondary | ICD-10-CM

## 2024-09-30 DIAGNOSIS — Z124 Encounter for screening for malignant neoplasm of cervix: Secondary | ICD-10-CM

## 2024-09-30 DIAGNOSIS — N760 Acute vaginitis: Secondary | ICD-10-CM

## 2024-09-30 DIAGNOSIS — Z114 Encounter for screening for human immunodeficiency virus [HIV]: Secondary | ICD-10-CM

## 2024-09-30 LAB — POCT URINALYSIS DIP (CLINITEK)
Bilirubin, UA: NEGATIVE
Glucose, UA: NEGATIVE mg/dL
Ketones, POC UA: NEGATIVE mg/dL
Nitrite, UA: POSITIVE — AB
Spec Grav, UA: 1.025 (ref 1.010–1.025)
Urobilinogen, UA: 0.2 U/dL
pH, UA: 6.5 (ref 5.0–8.0)

## 2024-09-30 MED ORDER — NITROFURANTOIN MONOHYD MACRO 100 MG PO CAPS: 100.0000 mg | ORAL_CAPSULE | Freq: Two times a day (BID) | ORAL | 0 refills | Status: AC

## 2024-09-30 NOTE — Progress Notes (Signed)
 Subjective:    Hannah Burch - 38 y.o. female MRN [id number]  Date of birth: 21-Nov-1985  HPI  Hannah Burch is to establish care.   Current issues and/or concerns: - Routine pap smear with STD/HIV testing.  - States discomfort with urination. Denies red flag symptoms. - Intermittent heart palpitations for the last 1 year. States notices especially when laying in bed.   ROS per HPI    Health Maintenance:   Health Maintenance Due  Topic Date Due   Hepatitis C Screening  Never done   Cervical Cancer Screening (HPV/Pap Cotest)  Never done     Past Medical History: Patient Active Problem List   Diagnosis Date Noted   SVD (spontaneous vaginal delivery) 07/30/2017   Normal pregnancy in multigravida in third trimester 07/29/2017   Pregnancy 07/29/2017   Rt flank pain 06/19/2017   Abnormal urine odor 06/19/2017   Laceration of periurethral tissue with delivery 07/02/2015   Group beta Strep positive 07/02/2015   Decreased fetal movement 06/28/2015   History of preterm delivery (IOL at 36.1 wks due to oligo) 06/28/2015   Irregular contractions 06/28/2015      Social History   reports that she has quit smoking. She has never used smokeless tobacco. She reports that she does not drink alcohol and does not use drugs.   Family History  family history includes Diabetes in her father; Hypertension in her mother.   Medications: reviewed and updated   Objective:   Physical Exam BP 111/78   Pulse 70   Temp 98.3 F (36.8 C) (Oral)   Resp 16   Ht 5' 2 (1.575 m)   Wt 118 lb 12.8 oz (53.9 kg)   LMP 09/14/2024 (Exact Date)   SpO2 99%   BMI 21.73 kg/m   Physical Exam HENT:     Head: Normocephalic and atraumatic.     Nose: Nose normal.     Mouth/Throat:     Mouth: Mucous membranes are moist.     Pharynx: Oropharynx is clear.  Eyes:     Extraocular Movements: Extraocular movements intact.     Conjunctiva/sclera: Conjunctivae normal.     Pupils: Pupils  are equal, round, and reactive to light.  Cardiovascular:     Rate and Rhythm: Normal rate and regular rhythm.     Pulses: Normal pulses.     Heart sounds: Normal heart sounds.  Pulmonary:     Effort: Pulmonary effort is normal.     Breath sounds: Normal breath sounds.  Genitourinary:    General: Normal vulva.     Vagina: Normal.     Cervix: Normal.     Uterus: Normal.      Adnexa: Right adnexa normal and left adnexa normal.     Comments: Purvis Pepper, CMA present. Musculoskeletal:        General: Normal range of motion.     Cervical back: Normal range of motion and neck supple.  Neurological:     General: No focal deficit present.     Mental Status: She is alert and oriented to person, place, and time.  Psychiatric:        Mood and Affect: Mood normal.        Behavior: Behavior normal.       Assessment & Plan:  1. Encounter to establish care (Primary) - Patient presents today to establish care. During the interim follow-up with primary provider as scheduled.  - Return for annual physical examination, labs, and health maintenance. Arrive fasting  meaning having no food for at least 8 hours prior to appointment. You may have only water or black coffee. Please take scheduled medications as normal.  2. Cervical cancer screening - Routine screening.  - Cytology - PAP([place])  3. Routine screening for STI (sexually transmitted infection) - Routine screening.  - Cervicovaginal ancillary only  4. Encounter for screening for HIV - Routine screening.  - HIV antibody (with reflex)  5. Lower urinary tract symptoms (LUTS) - Routine screening.  - POCT URINALYSIS DIP (CLINITEK); Future - Urine Culture  6. Palpitations - Patient today in office with no cardiopulmonary/acute distress.  - EKG for evaluation.  - Referral to Cardiology for evaluation/management.  - EKG 12-Lead - Ambulatory referral to Cardiology    Patient was given clear instructions to go to Emergency  Department or return to medical center if symptoms don't improve, worsen, or new problems develop.The patient verbalized understanding.  I discussed the assessment and treatment plan with the patient. The patient was provided an opportunity to ask questions and all were answered. The patient agreed with the plan and demonstrated an understanding of the instructions.   The patient was advised to call back or seek an in-person evaluation if the symptoms worsen or if the condition fails to improve as anticipated.    Greig Drones, NP 09/30/2024, 1:56 PM Primary Care at [facility]

## 2024-09-30 NOTE — Progress Notes (Signed)
 Possible UTI, pap smear

## 2024-10-01 ENCOUNTER — Ambulatory Visit: Attending: Physician Assistant | Admitting: Physician Assistant

## 2024-10-01 ENCOUNTER — Ambulatory Visit

## 2024-10-01 ENCOUNTER — Encounter: Payer: Self-pay | Admitting: Physician Assistant

## 2024-10-01 VITALS — BP 102/66 | HR 78 | Ht 60.0 in | Wt 118.2 lb

## 2024-10-01 DIAGNOSIS — R233 Spontaneous ecchymoses: Secondary | ICD-10-CM | POA: Diagnosis not present

## 2024-10-01 DIAGNOSIS — R002 Palpitations: Secondary | ICD-10-CM

## 2024-10-01 LAB — CERVICOVAGINAL ANCILLARY ONLY
Bacterial Vaginitis (gardnerella): POSITIVE — AB
Candida Glabrata: NEGATIVE
Candida Vaginitis: NEGATIVE
Chlamydia: NEGATIVE
Comment: NEGATIVE
Comment: NEGATIVE
Comment: NEGATIVE
Comment: NEGATIVE
Comment: NEGATIVE
Comment: NORMAL
Neisseria Gonorrhea: NEGATIVE
Trichomonas: NEGATIVE

## 2024-10-01 LAB — CYTOLOGY - PAP
Adequacy: ABSENT
Comment: NEGATIVE
Diagnosis: NEGATIVE
High risk HPV: NEGATIVE

## 2024-10-01 LAB — HIV ANTIBODY (ROUTINE TESTING W REFLEX): HIV Screen 4th Generation wRfx: NONREACTIVE

## 2024-10-01 MED ORDER — METRONIDAZOLE 500 MG PO TABS: 500.0000 mg | ORAL_TABLET | Freq: Two times a day (BID) | ORAL | 0 refills | Status: AC

## 2024-10-01 NOTE — Progress Notes (Unsigned)
 Enrolled patient for a 14 day Zio XT  monitor to be mailed to patients home

## 2024-10-01 NOTE — Patient Instructions (Signed)
 Medication Instructions:  Your physician recommends that you continue on your current medications as directed. Please refer to the Current Medication list given to you today.  *If you need a refill on your cardiac medications before your next appointment, please call your pharmacy*  Lab Work: TODAY:  BMET, CBC, TSH, & MAG  If you have labs (blood work) drawn today and your tests are completely normal, you will receive your results only by: MyChart Message (if you have MyChart) OR A paper copy in the mail If you have any lab test that is abnormal or we need to change your treatment, we will call you to review the results.  Testing/Procedures: Hannah Burch- Long Term Monitor Instructions  Your physician has requested you wear a ZIO patch monitor for 14 days.  This is a single patch monitor. [employer] supplies one patch monitor per enrollment. Additional stickers are not available. Please do not apply patch if you will be having a Nuclear Stress Test,  Echocardiogram, Cardiac CT, MRI, or Chest Xray during the period you would be wearing the  monitor. The patch cannot be worn during these tests. You cannot remove and re-apply the  ZIO XT patch monitor.  Your ZIO patch monitor will be mailed 3 day USPS to your address on file. It may take 3-5 days  to receive your monitor after you have been enrolled.  Once you have received your monitor, please review the enclosed instructions. Your monitor  has already been registered assigning a specific monitor serial # to you.  Billing and Patient Assistance Program Information  We have supplied [employer] with any of your insurance information on file for billing purposes. [employer] offers a sliding scale Patient Assistance Program for patients that do not have  insurance, or whose insurance does not completely cover the cost of the ZIO monitor.  You must apply for the Patient Assistance Program to qualify for this discounted rate.  To apply, please call [employer]  at [phone], select option 4, select option 2, ask to apply for  Patient Assistance Program. Meredeth will ask your household income, and how many people  are in your household. They will quote your out-of-pocket cost based on that information.  [employer] will also be able to set up a 60-month, interest-free payment plan if needed.  Applying the monitor   Shave hair from upper left chest.  Hold abrader disc by orange tab. Rub abrader in 40 strokes over the upper left chest as  indicated in your monitor instructions.  Clean area with 4 enclosed alcohol pads. Let dry.  Apply patch as indicated in monitor instructions. Patch will be placed under collarbone on left  side of chest with arrow pointing upward.  Rub patch adhesive wings for 2 minutes. Remove white label marked 1. Remove the white  label marked 2. Rub patch adhesive wings for 2 additional minutes.  While looking in a mirror, press and release button in center of patch. A small green light will  flash 3-4 times. This will be your only indicator that the monitor has been turned on.  Do not shower for the first 24 hours. You may shower after the first 24 hours.  Press the button if you feel a symptom. You will hear a small click. Record Date, Time and  Symptom in the Patient Logbook.  When you are ready to remove the patch, follow instructions on the last 2 pages of Patient  Logbook. Stick patch monitor onto the last page of Patient Logbook.  Place Patient Logbook in the blue and white box. Use locking tab on box and tape box closed  securely. The blue and white box has prepaid postage on it. Please place it in the mailbox as  soon as possible. Your physician should have your test results approximately 7 days after the  monitor has been mailed back to [facility].  Call [facility] Customer Care at [phone] if you have questions regarding  your ZIO XT patch monitor. Call them immediately if you see an orange light  blinking on your  monitor.  If your monitor falls off in less than 4 days, contact our Monitor department at [phone].  If your monitor becomes loose or falls off after 4 days call [employer] at [phone] for  suggestions on securing your monitor   Follow-Up: At [facility], you and your health needs are our priority.  As part of our continuing mission to provide you with exceptional heart care, our providers are all part of one team.  This team includes your primary Cardiologist (physician) and Advanced Practice Providers or APPs (Physician Assistants and Nurse Practitioners) who all work together to provide you with the care you need, when you need it.  Your next appointment:   As needed   Provider:   Glendia Ferrier, PA-C          We recommend signing up for the patient portal called MyChart.  Sign up information is provided on this After Visit Summary.  MyChart is used to connect with patients for Virtual Visits (Telemedicine).  Patients are able to view lab/test results, encounter notes, upcoming appointments, etc.  Non-urgent messages can be sent to your provider as well.   To learn more about what you can do with MyChart, go to [email or web address].   Other Instructions

## 2024-10-01 NOTE — Progress Notes (Signed)
 OFFICE NOTE:    Date:  10/01/2024  ID:  Hannah Burch, DOB 11/05/1985, MRN [id number] PCP: Jaycee Greig PARAS, NP  [facility] Providers Cardiologist:  None              Discussed the use of AI scribe software for clinical note transcription with the patient, who gave verbal consent to proceed. History of Present Illness Hannah Burch is a 38 y.o. female referred by Jaycee Greig PARAS, NP for palpitations.    She experiences heart palpitations primarily when lying down, describing the sensation as her heart 'pausing' or 'skipping,' which sometimes makes her feel like she is 'about to die.' This sensation is accompanied by a feeling of tightness in her chest, and she often feels the need to 'stretch it out.' These episodes occur mostly when she is lying on her back and are not associated with any specific activities or meals. No shortness of breath, syncope, or leg swelling. She reports easy bruising on her legs without any known trauma. She engages in regular physical activity, running about two miles two to three times a week, and does not experience significant symptoms during exercise, although she has felt tightness in her chest while running.   She does not consume alcohol, drugs, or caffeine, and quit smoking over 18 years ago. She works as an diplomatic services operational officer for [employer], is divorced, and has four children.  No family history of heart attack, heart failure, or sudden cardiac death at a young age.    ROS-See HPI      Studies Reviewed:       Results DIAGNOSTIC EKG: Normal sinus rhythm, heart rate 65 bpm, normal axis, no ST-T wave changes, QTc 384 ms, no evidence of pre-excitation or Brugada pattern (09/30/2024)         Physical Exam:  VS:  BP 102/66   Pulse 78   Ht 5' (1.524 m)   Wt 118 lb 3.2 oz (53.6 kg)   LMP 09/14/2024 (Exact Date)   SpO2 99%   BMI 23.08 kg/m        Wt Readings from Last 3 Encounters:  10/01/24 118 lb  3.2 oz (53.6 kg)  09/30/24 118 lb 12.8 oz (53.9 kg)  11/27/18 115 lb (52.2 kg)    Constitutional:      Appearance: Healthy appearance. Not in distress.  Neck:     Thyroid: No thyromegaly.     Vascular: No carotid bruit. JVD normal.  Pulmonary:     Breath sounds: Normal breath sounds. No wheezing. No rales.  Cardiovascular:     Normal rate. Regular rhythm.     Murmurs: There is no murmur.  Edema:    Peripheral edema absent.  Abdominal:     Palpations: Abdomen is soft.       Assessment and Plan:    Assessment & Plan Palpitations Intermittent palpitations primarily when supine, described as a sensation of heart pausing or skipping, accompanied by occasional chest tightness. No associated syncope, dyspnea, or significant exertional symptoms. EKG shows normal sinus rhythm with no ST-T wave changes or evidence of pre-excitation. Differential includes premature atrial contractions (PACs) or premature ventricular contractions (PVCs), which are typically benign. She may be describing atypical symptoms of acid reflux. - Order Zio XT x 2 weeks to assess for arrhythmias. - Order CBC, BMET, Magnesium, TSH - She can try OTC Pepcid in the evening for one to two weeks to assess for potential indigestion-related symptoms. - Follow up as  needed based upon test results Easy bruising Reports of easy bruising on legs without trauma. - Order CBC        Dispo:  Return for follow up as needed based upon test results .  Signed, Glendia Ferrier, PA-C

## 2024-10-02 ENCOUNTER — Ambulatory Visit: Payer: Self-pay | Admitting: Physician Assistant

## 2024-10-02 LAB — URINE CULTURE

## 2024-10-02 LAB — CBC
Hematocrit: 39.5 % (ref 34.0–46.6)
Hemoglobin: 12.7 g/dL (ref 11.1–15.9)
MCH: 29.7 pg (ref 26.6–33.0)
MCHC: 32.2 g/dL (ref 31.5–35.7)
MCV: 92 fL (ref 79–97)
Platelets: 221 x10E3/uL (ref 150–450)
RBC: 4.28 x10E6/uL (ref 3.77–5.28)
RDW: 12.5 % (ref 11.7–15.4)
WBC: 6.1 x10E3/uL (ref 3.4–10.8)

## 2024-10-02 LAB — BASIC METABOLIC PANEL WITH GFR
BUN/Creatinine Ratio: 13 (ref 9–23)
BUN: 12 mg/dL (ref 6–20)
CO2: 22 mmol/L (ref 20–29)
Calcium: 8.8 mg/dL (ref 8.7–10.2)
Chloride: 104 mmol/L (ref 96–106)
Creatinine, Ser: 0.89 mg/dL (ref 0.57–1.00)
Glucose: 75 mg/dL (ref 70–99)
Potassium: 4.2 mmol/L (ref 3.5–5.2)
Sodium: 140 mmol/L (ref 134–144)
eGFR: 85 mL/min/1.73 (ref >59)

## 2024-10-02 LAB — MAGNESIUM: Magnesium: 2.1 mg/dL (ref 1.6–2.3)

## 2024-10-02 LAB — TSH: TSH: 0.946 u[IU]/mL (ref 0.450–4.500)

## 2024-10-02 MED ORDER — AMOXICILLIN-POT CLAVULANATE 875-125 MG PO TABS: 1.0000 | ORAL_TABLET | Freq: Two times a day (BID) | ORAL | 0 refills | Status: AC

## 2024-10-04 ENCOUNTER — Other Ambulatory Visit: Payer: Self-pay | Admitting: Family

## 2024-10-04 ENCOUNTER — Telehealth: Payer: Self-pay | Admitting: Emergency Medicine

## 2024-10-04 DIAGNOSIS — B9689 Other specified bacterial agents as the cause of diseases classified elsewhere: Secondary | ICD-10-CM

## 2024-10-04 DIAGNOSIS — N3000 Acute cystitis without hematuria: Secondary | ICD-10-CM

## 2024-10-04 MED ORDER — AUGMENTIN 125-31.25 MG/5ML PO SUSR: 33.0000 mg/kg/d | Freq: Two times a day (BID) | ORAL | 0 refills | Status: AC

## 2024-10-04 MED ORDER — METRONIDAZOLE 0.75 % VA GEL: 1.0000 | Freq: Two times a day (BID) | VAGINAL | 2 refills | Status: AC

## 2024-10-04 NOTE — Telephone Encounter (Signed)
 Copied from CRM [id number]. Topic: Clinical - Medication Question >> Oct 04, 2024 10:48 AM Delon HERO wrote: Reason for CRM: Patient is calling to report that the amoxicillin -clavulanate (AUGMENTIN ) 875-125 MG tablet [[id number]], metroNIDAZOLE  (FLAGYL ) 500 MG tablet [[id number]], nitrofurantoin , macrocrystal-monohydrate, (MACROBID ) 100 MG capsule [[id number]] pills are too large for her. Especially the amoxicillin -clavulanate (AUGMENTIN ) 875-125 MG tablet [[id number]]  Patient is requesting a smaller pill or liquid. Please advise

## 2024-10-04 NOTE — Telephone Encounter (Signed)
-   Amoxicllin-Clavulanate suspension and Metronidazole  Vaginal gel prescribed as alternatives.  - No known alternative for Nitrofurantoin , Macrocrystal-Monohydrate.

## 2024-10-10 NOTE — Telephone Encounter (Signed)
 I called patient and made her aware - Amoxicllin-Clavulanate suspension and Metronidazole  Vaginal gel prescribed as alternatives.  - No known alternative for Nitrofurantoin , Macrocrystal-Monohydrate.

## 2024-10-11 ENCOUNTER — Telehealth: Payer: Self-pay

## 2024-10-11 NOTE — Telephone Encounter (Signed)
 Copied from CRM #[id number]. Topic: Clinical - Prescription Issue >> Oct 11, 2024  8:20 AM Berneda FALCON wrote: Reason for CRM: Patient states that the medication is needing to be liquid form but the pharmacy does not have this in the strength required for this particular brand.  Is there something else we can call in or another name brand that they could use for this medication.  Medication: amoxicillin -clavulanate (AUGMENTIN ) 125-31.25 MG/5ML suspension  Pharmacy: [id number] - [place], [place] - [address] AT [facility] [address] [place] [address] Phone: [phone] Fax: [phone] Hours: Open 24 hours  Patient callback is [phone] (home)

## 2024-10-11 NOTE — Telephone Encounter (Signed)
 I am not aware of an alternate for Amoxicillin -Clavulanate suspension for UTI. Dr. Tanda if you will please advise. Thank you.

## 2024-10-14 ENCOUNTER — Other Ambulatory Visit: Payer: Self-pay | Admitting: Family

## 2024-10-14 ENCOUNTER — Telehealth: Payer: Self-pay | Admitting: Family

## 2024-10-14 DIAGNOSIS — N3001 Acute cystitis with hematuria: Secondary | ICD-10-CM

## 2024-10-14 MED ORDER — AMOXICILLIN-POT CLAVULANATE 200-28.5 MG/5ML PO SUSR: 200.0000 mg | Freq: Two times a day (BID) | ORAL | 0 refills | Status: AC

## 2024-10-14 NOTE — Telephone Encounter (Signed)
 Complete

## 2024-10-14 NOTE — Telephone Encounter (Signed)
 Copied from CRM #[id number]. Topic: Clinical - Prescription Issue >> Oct 14, 2024  3:20 PM Hannah Burch wrote:  Reason for CRM: Pt calling to follow up regarding amoxicillin -clavulanate (AUGMENTIN ) 125-31.25 MG/5ML suspension. Pharmacy does not have the strength. Pt wants to know if another strength or antibiotic can be prescribed. She said the pharmacy mentioned diflucan .

## 2024-10-15 ENCOUNTER — Other Ambulatory Visit: Payer: Self-pay | Admitting: Family

## 2024-10-15 DIAGNOSIS — B3731 Acute candidiasis of vulva and vagina: Secondary | ICD-10-CM

## 2024-10-15 MED ORDER — FLUCONAZOLE 150 MG PO TABS: 150.0000 mg | ORAL_TABLET | ORAL | 0 refills | Status: AC

## 2024-10-15 NOTE — Telephone Encounter (Signed)
 Complete

## 2024-10-18 NOTE — Telephone Encounter (Signed)
 Amoxicillin -Clavulanate suspension prescribed (10/14/2024  4:01 PM EST). Fluconazole  prescribed (10/15/2024  8:06 AM EST).

## 2024-10-21 NOTE — Telephone Encounter (Signed)
 Noted

## 2025-05-06 ENCOUNTER — Encounter: Admitting: Family
